# Patient Record
Sex: Female | Born: 1985
Health system: Southern US, Community
[De-identification: ages and names within clinical notes are randomized; demographics above are authoritative.]

## PROBLEM LIST (undated history)

## (undated) ENCOUNTER — Inpatient Hospital Stay (HOSPITAL_COMMUNITY): Payer: Self-pay

## (undated) DIAGNOSIS — B009 Herpesviral infection, unspecified: Secondary | ICD-10-CM

## (undated) DIAGNOSIS — Z789 Other specified health status: Secondary | ICD-10-CM

## (undated) HISTORY — DX: Herpesviral infection, unspecified: B00.9

## (undated) HISTORY — PX: NO PAST SURGERIES: SHX2092

## (undated) HISTORY — DX: Other specified health status: Z78.9

---

## 2013-09-23 ENCOUNTER — Emergency Department (HOSPITAL_COMMUNITY)
Admission: EM | Admit: 2013-09-23 | Discharge: 2013-09-23 | Disposition: A | Discharge status: Home / Self Care | Payer: BC Managed Care – PPO | Attending: Emergency Medicine | Admitting: Emergency Medicine

## 2013-09-23 ENCOUNTER — Encounter (HOSPITAL_COMMUNITY): Payer: Self-pay | Admitting: Emergency Medicine

## 2013-09-23 DIAGNOSIS — F17200 Nicotine dependence, unspecified, uncomplicated: Secondary | ICD-10-CM | POA: Insufficient documentation

## 2013-09-23 DIAGNOSIS — R110 Nausea: Secondary | ICD-10-CM | POA: Insufficient documentation

## 2013-09-23 DIAGNOSIS — R51 Headache: Secondary | ICD-10-CM | POA: Insufficient documentation

## 2013-09-23 DIAGNOSIS — R519 Headache, unspecified: Secondary | ICD-10-CM

## 2013-09-23 MED ORDER — SODIUM CHLORIDE 0.9 % IV BOLUS (SEPSIS)
1000.0000 mL | Freq: Once | INTRAVENOUS | Status: AC
  Administered 2013-09-23: 1000 mL via INTRAVENOUS

## 2013-09-23 MED ORDER — DEXAMETHASONE SODIUM PHOSPHATE 4 MG/ML IJ SOLN
10.0000 mg | Freq: Once | INTRAMUSCULAR | Status: AC
  Administered 2013-09-23: 10 mg via INTRAVENOUS
  Filled 2013-09-23: qty 3

## 2013-09-23 MED ORDER — IBUPROFEN 800 MG PO TABS: 800.0000 mg | ORAL_TABLET | Freq: Three times a day (TID) | ORAL | Status: DC

## 2013-09-23 MED ORDER — METOCLOPRAMIDE HCL 5 MG/ML IJ SOLN
10.0000 mg | Freq: Once | INTRAMUSCULAR | Status: AC
  Administered 2013-09-23: 10 mg via INTRAVENOUS
  Filled 2013-09-23: qty 2

## 2013-09-23 MED ORDER — PROMETHAZINE HCL 25 MG PO TABS: 25.0000 mg | ORAL_TABLET | Freq: Four times a day (QID) | ORAL | Status: DC | PRN

## 2013-09-23 MED ORDER — DIPHENHYDRAMINE HCL 50 MG/ML IJ SOLN
25.0000 mg | Freq: Once | INTRAMUSCULAR | Status: AC
  Administered 2013-09-23: 25 mg via INTRAVENOUS
  Filled 2013-09-23: qty 1

## 2013-09-23 NOTE — ED Provider Notes (Signed)
CSN: [id number]     Arrival date & time 09/23/13  0053 History   First MD Initiated Contact with Patient 09/23/13 0058     Chief Complaint  Patient presents with  . Headache  . Nausea     (Consider location/radiation/quality/duration/timing/severity/associated sxs/prior Treatment) HPI The history is provided by the patient. Left-sided headache onset one to 2 weeks ago has been on and off. She's been having trouble sleeping over this timeframe due to headache and nausea. Tonight symptoms seem more severe. Has associated nausea vomiting. No diarrhea. No fevers or chills. No neck pain or neck stiffness. No rash. No recent travel. No known sick contacts. Pain is moderate to severe, throbbing in quality. Patient states she has history of migraines as a child but not as an adult. She does have family history of migraine headaches. No change in vision. No weakness or numbness. Symptoms gradually onset and progressively worsening. Some intermittent relief with over-the-counter medications but not tonight.   History reviewed. No pertinent past medical history. History reviewed. No pertinent past surgical history. No family history on file. History  Substance Use Topics  . Smoking status: Current Every Day Smoker  . Smokeless tobacco: Not on file  . Alcohol Use: Yes   OB History   Grav Para Term Preterm Abortions TAB SAB Ect Mult Living                 Review of Systems  Constitutional: Negative for fever and chills.  Eyes: Negative for pain.  Respiratory: Negative for shortness of breath.   Cardiovascular: Negative for chest pain.  Gastrointestinal: Positive for nausea. Negative for abdominal pain.  Genitourinary: Negative for dysuria.  Musculoskeletal: Negative for back pain, neck pain and neck stiffness.  Skin: Negative for rash.  Neurological: Positive for headaches. Negative for seizures, syncope, weakness and numbness.  All other systems reviewed and are  negative.      Allergies  Review of patient's allergies indicates no known allergies.  Home Medications   Current Outpatient Rx  Name  Route  Sig  Dispense  Refill  . acetaminophen (TYLENOL) 325 MG tablet   Oral   Take 650 mg by mouth every 6 (six) hours as needed.         Marland Kitchen. ibuprofen (ADVIL,MOTRIN) 200 MG tablet   Oral   Take 200 mg by mouth every 6 (six) hours as needed.          BP 139/87  Pulse 98  Resp 18  Ht 5\' 11"  (1.803 m)  Wt 198 lb (89.812 kg)  BMI 27.63 kg/m2  SpO2 100%  LMP 09/10/2013 Physical Exam  Constitutional: She is oriented to person, place, and time. She appears well-developed and well-nourished.  HENT:  Head: Normocephalic and atraumatic.  No tenderness over TMJ or temporal arteries. No trismus. TMs clear.   Eyes: Conjunctivae and EOM are normal. Pupils are equal, round, and reactive to light.  Neck: Full passive range of motion without pain. Neck supple. No thyromegaly present.  No meningismus  Cardiovascular: Normal rate, regular rhythm, S1 normal, S2 normal and intact distal pulses.   Pulmonary/Chest: Effort normal and breath sounds normal.  Abdominal: Soft. Bowel sounds are normal. There is no tenderness. There is no CVA tenderness.  Musculoskeletal: Normal range of motion.  Neurological: She is alert and oriented to person, place, and time. She has normal strength and normal reflexes. No cranial nerve deficit or sensory deficit. She displays a negative Romberg sign. GCS eye subscore is 4.  GCS verbal subscore is 5. GCS motor subscore is 6.  Normal Gait  Skin: Skin is warm and dry. No rash noted. No cyanosis. Nails show no clubbing.  Psychiatric: She has a normal mood and affect. Her speech is normal and behavior is normal.    ED Course  Procedures (including critical care time) Labs Review Labs Reviewed - No data to display Imaging Review No results found.  IV fluids and headache cocktail provided: Benadryl, Decadron, Reglan  On  recheck headache significantly improved. No further nausea. Tolerates by mouth fluids. Ambulates to the bathroom no acute distress.  Plan discharge home with prescription for Phenergan and Motrin as needed. Patient agrees to fully sleep. Work note provided. Outpatient referrals as needed. Patient verbalizes understanding all discharge and followup instructions and strict return precautions  MDM   Dx: headache  Improved with IV fluids and medications. Serial normal neuro exams. No indication for advanced imaging or lumbar puncture at this time. Vital signs and nurses notes reviewed and considered   Sunnie Nielsen, MD 09/23/13 [phone]

## 2013-09-23 NOTE — ED Notes (Signed)
Headache off and on for a couple of weeks, nausea

## 2013-09-23 NOTE — Discharge Instructions (Signed)
Migraine Headache A migraine headache is an intense, throbbing pain on one or both sides of your head. A migraine can last for 30 minutes to several hours. CAUSES  The exact cause of a migraine headache is not always known. However, a migraine may be caused when nerves in the brain become irritated and release chemicals that cause inflammation. This causes pain. Certain things may also trigger migraines, such as:  Alcohol.  Smoking.  Stress.  Menstruation.  Aged cheeses.  Foods or drinks that contain nitrates, glutamate, aspartame, or tyramine.  Lack of sleep.  Chocolate.  Caffeine.  Hunger.  Physical exertion.  Fatigue.  Medicines used to treat chest pain (nitroglycerine), birth control pills, estrogen, and some blood pressure medicines. SIGNS AND SYMPTOMS  Pain on one or both sides of your head.  Pulsating or throbbing pain.  Severe pain that prevents daily activities.  Pain that is aggravated by any physical activity.  Nausea, vomiting, or both.  Dizziness.  Pain with exposure to bright lights, loud noises, or activity.  General sensitivity to bright lights, loud noises, or smells. Before you get a migraine, you may get warning signs that a migraine is coming (aura). An aura may include:  Seeing flashing lights.  Seeing bright spots, halos, or zig-zag lines.  Having tunnel vision or blurred vision.  Having feelings of numbness or tingling.  Having trouble talking.  Having muscle weakness. DIAGNOSIS  A migraine headache is often diagnosed based on:  Symptoms.  Physical exam.  A CT scan or MRI of your head. These imaging tests cannot diagnose migraines, but they can help rule out other causes of headaches. TREATMENT Medicines may be given for pain and nausea. Medicines can also be given to help prevent recurrent migraines.  HOME CARE INSTRUCTIONS  Only take over-the-counter or prescription medicines for pain or discomfort as directed by your  health care provider. The use of long-term narcotics is not recommended.  Lie down in a dark, quiet room when you have a migraine.  Keep a journal to find out what may trigger your migraine headaches. For example, write down:  What you eat and drink.  How much sleep you get.  Any change to your diet or medicines.  Limit alcohol consumption.  Quit smoking if you smoke.  Get 7 9 hours of sleep, or as recommended by your health care provider.  Limit stress.  Keep lights dim if bright lights bother you and make your migraines worse. SEEK IMMEDIATE MEDICAL CARE IF:   Your migraine becomes severe.  You have a fever.  You have a stiff neck.  You have vision loss.  You have muscular weakness or loss of muscle control.  You start losing your balance or have trouble walking.  You feel faint or pass out.  You have severe symptoms that are different from your first symptoms. MAKE SURE YOU:   Understand these instructions.  Will watch your condition.  Will get help right away if you are not doing well or get worse. Document Released: 07/18/2005 Document Revised: 05/08/2013 Document Reviewed: 03/25/2013 [employer] Patient Information 2014 [employer].  

## 2016-04-10 ENCOUNTER — Emergency Department (HOSPITAL_COMMUNITY): Payer: BLUE CROSS/BLUE SHIELD

## 2016-04-10 ENCOUNTER — Encounter (HOSPITAL_COMMUNITY): Payer: Self-pay | Admitting: Emergency Medicine

## 2016-04-10 ENCOUNTER — Emergency Department (HOSPITAL_COMMUNITY)
Admission: EM | Admit: 2016-04-10 | Discharge: 2016-04-10 | Disposition: A | Discharge status: Home / Self Care | Payer: BLUE CROSS/BLUE SHIELD | Attending: Emergency Medicine | Admitting: Emergency Medicine

## 2016-04-10 DIAGNOSIS — R519 Headache, unspecified: Secondary | ICD-10-CM

## 2016-04-10 DIAGNOSIS — R51 Headache: Secondary | ICD-10-CM | POA: Insufficient documentation

## 2016-04-10 DIAGNOSIS — F17210 Nicotine dependence, cigarettes, uncomplicated: Secondary | ICD-10-CM | POA: Diagnosis not present

## 2016-04-10 LAB — POC URINE PREG, ED: Preg Test, Ur: NEGATIVE

## 2016-04-10 MED ORDER — PROCHLORPERAZINE EDISYLATE 5 MG/ML IJ SOLN
10.0000 mg | Freq: Once | INTRAMUSCULAR | Status: AC
Start: 2016-04-10 — End: 2016-04-10
  Administered 2016-04-10: 10 mg via INTRAVENOUS
  Filled 2016-04-10: qty 2

## 2016-04-10 MED ORDER — DIPHENHYDRAMINE HCL 50 MG/ML IJ SOLN
25.0000 mg | Freq: Once | INTRAMUSCULAR | Status: AC
  Administered 2016-04-10: 25 mg via INTRAVENOUS
  Filled 2016-04-10: qty 1

## 2016-04-10 MED ORDER — BUTALBITAL-APAP-CAFFEINE 50-325-40 MG PO TABS: 1.0000 | ORAL_TABLET | Freq: Four times a day (QID) | ORAL | 0 refills | Status: DC | PRN

## 2016-04-10 MED ORDER — KETOROLAC TROMETHAMINE 30 MG/ML IJ SOLN
30.0000 mg | Freq: Once | INTRAMUSCULAR | Status: AC
  Administered 2016-04-10: 30 mg via INTRAVENOUS
  Filled 2016-04-10: qty 1

## 2016-04-10 NOTE — ED Notes (Signed)
URINE PREG NEGATIVE 

## 2016-04-10 NOTE — ED Triage Notes (Signed)
Patient c/o intermittent headache x1 month that has progressively gotten worse in past week. Per patient sensitivity to light and sound with nausea and neck pain. Per patient taking tylenol, tylenol sinus, nightquil, and ibuprofen with no relief.

## 2016-04-10 NOTE — ED Provider Notes (Signed)
[facility]-EMERGENCY DEPT Provider Note   CSN: [id number] Arrival date & time: 04/10/16  1643  By signing my name below, I, Christy SartoriusAnastasia Kolousek, attest that this documentation has been prepared under the direction and in the presence of  Burgess AmorJulie Stephon Weathers, PA-C. Electronically Signed: Christy SartoriusAnastasia Kolousek, ED Scribe. 04/10/16. 6:38 PM.  History   Chief Complaint Chief Complaint  Patient presents with  . Headache   The history is provided by the patient and medical records. No language interpreter was used.    HPI Comments:  Deborah Taylor is a 30 y.o. female who presents to the Emergency Department complaining of constant migraines for the last week.  She states she has a persistent dull pain during the day, but at night when she lies down to go to sleep her pain is worse.  She describes it as sharp pains that begin above her eyebrow and at the top of her head and radiate difusely.  She has a history of migraines, but states that they are never this frequent and are not worse at night.  She also notes that she normally has nausea, light sensitivity and changes to her vision with her migraines, but denies nausea and change in vision with this headache.    She has tried ibuprofen, Tylenol, Advil and Tylenol sinus without relief.   She has a family history of brain tumors and is concerned about this history.  She denies neck stiffness, head injury, numbness, increased stress, changes in diet or fluid intake and known medicinal allergies.  Her PCP is Dr. Catalina PizzaZach Hall.    History reviewed. No pertinent past medical history.  There are no active problems to display for this patient.   History reviewed. No pertinent surgical history.  OB History    Gravida Para Term Preterm AB Living             0   SAB TAB Ectopic Multiple Live Births                   Home Medications    Prior to Admission medications   Medication Sig Start Date End Date Taking? Authorizing Provider  acetaminophen (TYLENOL) 325 MG  tablet Take 650 mg by mouth every 6 (six) hours as needed.    Historical Provider, MD  ibuprofen (ADVIL,MOTRIN) 200 MG tablet Take 200 mg by mouth every 6 (six) hours as needed.    Historical Provider, MD  ibuprofen (ADVIL,MOTRIN) 800 MG tablet Take 1 tablet (800 mg total) by mouth 3 (three) times daily. 09/23/13   Sunnie NielsenBrian Opitz, MD  promethazine (PHENERGAN) 25 MG tablet Take 1 tablet (25 mg total) by mouth every 6 (six) hours as needed for nausea or vomiting. 09/23/13   Sunnie NielsenBrian Opitz, MD    Family History History reviewed. No pertinent family history.  Social History Social History  Substance Use Topics  . Smoking status: Current Every Day Smoker    Packs/day: 0.25    Years: 10.00    Types: Cigarettes  . Smokeless tobacco: Never Used  . Alcohol use No     Allergies   Review of patient's allergies indicates no known allergies.   Review of Systems Review of Systems  Constitutional: Negative for fever.  HENT: Negative for congestion and sore throat.   Eyes: Negative.   Respiratory: Negative for chest tightness and shortness of breath.   Cardiovascular: Negative for chest pain.  Gastrointestinal: Negative for abdominal pain and nausea.  Genitourinary: Negative.   Musculoskeletal: Negative for arthralgias, joint swelling  and neck pain.  Skin: Negative.  Negative for rash and wound.  Neurological: Positive for headaches. Negative for dizziness, weakness, light-headedness and numbness.  Psychiatric/Behavioral: Negative.      Physical Exam Updated Vital Signs BP 120/65 (BP Location: Left Arm)   Pulse 60   Temp 98.3 F (36.8 C) (Oral)   Resp 16   Ht 5\' 11"  (1.803 m)   Wt 235 lb (106.6 kg)   LMP 03/16/2016   SpO2 100%   BMI 32.78 kg/m   Physical Exam  Constitutional: She is oriented to person, place, and time. She appears well-developed and well-nourished. No distress.  Uncomfortable appearing  HENT:  Head: Normocephalic and atraumatic.  Mouth/Throat: Oropharynx is clear  and moist.  Eyes: Conjunctivae and EOM are normal. Pupils are equal, round, and reactive to light.  Neck: Normal range of motion. Neck supple.  Cardiovascular: Normal rate and normal heart sounds.   Pulmonary/Chest: Effort normal.  Abdominal: Soft. There is no tenderness.  Musculoskeletal: Normal range of motion.  Lymphadenopathy:    She has no cervical adenopathy.  Neurological: She is alert and oriented to person, place, and time. She has normal strength. No sensory deficit. Gait normal. GCS eye subscore is 4. GCS verbal subscore is 5. GCS motor subscore is 6.  Normal heel-shin, normal rapid alternating movements. Cranial nerves III-XII intact.  No pronator drift.  Skin: Skin is warm and dry. No rash noted.  Psychiatric: She has a normal mood and affect. Her speech is normal and behavior is normal. Thought content normal. Cognition and memory are normal.  Nursing note and vitals reviewed.    ED Treatments / Results   DIAGNOSTIC STUDIES:  Oxygen Saturation is 100% on RA, NML by my interpretation.    COORDINATION OF CARE:  6:09 PM Discussed treatment plfan with pt at bedside and pt agreed to plan.  Labs (all labs ordered are listed, but only abnormal results are displayed) Labs Reviewed - No data to display  EKG  EKG Interpretation None       Radiology Ct Head Wo Contrast  Result Date: 04/10/2016 CLINICAL DATA:  Left-sided headache for 1 week with pain behind the left eye. EXAM: CT HEAD WITHOUT CONTRAST TECHNIQUE: Contiguous axial images were obtained from the base of the skull through the vertex without intravenous contrast. COMPARISON:  None. FINDINGS: Brain: No evidence of acute infarction, hemorrhage, hydrocephalus, extra-axial collection or mass lesion/mass effect. Presumed dilated perivascular spaces below the putamina. Vascular: No hyperdense vessel or unexpected calcification. Skull: Normal. Negative for fracture or focal lesion. Sinuses/Orbits: Negative. No acute  sinusitis. No orbital mass or inflammation. Other: None. IMPRESSION: Negative.  No explanation for headache. Electronically Signed   By: Marnee Spring M.D.   On: 04/10/2016 19:18    Procedures Procedures (including critical care time)  Medications Ordered in ED Medications  prochlorperazine (COMPAZINE) injection 10 mg (10 mg Intravenous Given 04/10/16 1842)  diphenhydrAMINE (BENADRYL) injection 25 mg (25 mg Intravenous Given 04/10/16 1846)  ketorolac (TORADOL) 30 MG/ML injection 30 mg (30 mg Intravenous Given 04/10/16 1843)     Initial Impression / Assessment and Plan / ED Course  I have reviewed the triage vital signs and the nursing notes.  Pertinent labs & imaging results that were available during my care of the patient were reviewed by me and considered in my medical decision making (see chart for details).  Clinical Course    Pt given migraine cocktail meds with resolution of headache.  Prescribed fioricet. Advised recheck by  pcp prn if sx persist.  Final Clinical Impressions(s) / ED Diagnoses   Final diagnoses:  None    New Prescriptions New Prescriptions   BUTALBITAL-ACETAMINOPHEN-CAFFEINE (FIORICET) 50-325-40 MG TABLET    Take 1 tablet by mouth every 6 (six) hours as needed for headache.   I personally performed the services described in this documentation, which was scribed in my presence. The recorded information has been reviewed and is accurate.     Burgess Amor, PA-C 04/10/16 2015    Vanetta Mulders, MD 04/13/16 [phone]

## 2016-07-05 ENCOUNTER — Encounter: Payer: Self-pay | Admitting: Adult Health

## 2016-07-11 ENCOUNTER — Other Ambulatory Visit: Payer: Self-pay | Admitting: Obstetrics and Gynecology

## 2016-07-11 DIAGNOSIS — O3680X0 Pregnancy with inconclusive fetal viability, not applicable or unspecified: Secondary | ICD-10-CM

## 2016-07-12 ENCOUNTER — Encounter: Payer: Self-pay | Admitting: *Deleted

## 2016-07-12 ENCOUNTER — Ambulatory Visit (INDEPENDENT_AMBULATORY_CARE_PROVIDER_SITE_OTHER): Payer: BLUE CROSS/BLUE SHIELD

## 2016-07-12 ENCOUNTER — Ambulatory Visit (INDEPENDENT_AMBULATORY_CARE_PROVIDER_SITE_OTHER): Payer: BLUE CROSS/BLUE SHIELD | Admitting: *Deleted

## 2016-07-12 VITALS — BP 124/90 | HR 80 | Wt 239.0 lb

## 2016-07-12 DIAGNOSIS — Z1389 Encounter for screening for other disorder: Secondary | ICD-10-CM

## 2016-07-12 DIAGNOSIS — Z3682 Encounter for antenatal screening for nuchal translucency: Secondary | ICD-10-CM

## 2016-07-12 DIAGNOSIS — Z331 Pregnant state, incidental: Secondary | ICD-10-CM | POA: Diagnosis not present

## 2016-07-12 DIAGNOSIS — O3680X0 Pregnancy with inconclusive fetal viability, not applicable or unspecified: Secondary | ICD-10-CM | POA: Diagnosis not present

## 2016-07-12 DIAGNOSIS — Z3401 Encounter for supervision of normal first pregnancy, first trimester: Secondary | ICD-10-CM | POA: Diagnosis not present

## 2016-07-12 DIAGNOSIS — Z3481 Encounter for supervision of other normal pregnancy, first trimester: Secondary | ICD-10-CM

## 2016-07-12 DIAGNOSIS — Z3A08 8 weeks gestation of pregnancy: Secondary | ICD-10-CM

## 2016-07-12 DIAGNOSIS — O99331 Smoking (tobacco) complicating pregnancy, first trimester: Secondary | ICD-10-CM

## 2016-07-12 DIAGNOSIS — F17200 Nicotine dependence, unspecified, uncomplicated: Secondary | ICD-10-CM | POA: Insufficient documentation

## 2016-07-12 LAB — POCT URINALYSIS DIPSTICK
Blood, UA: NEGATIVE
GLUCOSE UA: NEGATIVE
Ketones, UA: NEGATIVE
LEUKOCYTES UA: NEGATIVE
NITRITE UA: NEGATIVE
Protein, UA: NEGATIVE

## 2016-07-12 NOTE — Progress Notes (Signed)
US 8+1 wks,single IUP w/ys,pos fht 168 bpm,normal ov's bilat,crl 18.9 mm,EDD 02/20/2017 by LMP

## 2016-07-12 NOTE — Progress Notes (Deleted)
Patient's health and obstetrical history obtained and reviewed. No significant medical or obstetrical history. CCNC form completed. PN1 labs drawn. Baby scripts and mychart activated. Patient scheduled for new ob visit.   Patient Active Problem List   Diagnosis Date Noted  . Smoker 07/12/2016  . Supervision of normal first pregnancy 07/12/2016   Past Medical History:  Diagnosis Date  . Medical history non-contributory    Past Surgical History:  Procedure Laterality Date  . NO PAST SURGERIES     OB History    Gravida Para Term Preterm AB Living   2       1 0   SAB TAB Ectopic Multiple Live Births     1            Current Outpatient Prescriptions:  .  acetaminophen (TYLENOL) 325 MG tablet, Take 650 mg by mouth every 6 (six) hours as needed., Disp: , Rfl:  .  prenatal vitamin w/FE, FA (PRENATAL 1 + 1) 27-1 MG TABS tablet, Take 1 tablet by mouth daily at 12 noon., Disp: , Rfl:  .  butalbital-acetaminophen-caffeine (FIORICET) 50-325-40 MG tablet, Take 1 tablet by mouth every 6 (six) hours as needed for headache. (Patient not taking: Reported on 07/12/2016), Disp: 20 tablet, Rfl: 0 .  ibuprofen (ADVIL,MOTRIN) 200 MG tablet, Take 200 mg by mouth every 6 (six) hours as needed., Disp: , Rfl:  .  ibuprofen (ADVIL,MOTRIN) 800 MG tablet, Take 1 tablet (800 mg total) by mouth 3 (three) times daily. (Patient not taking: Reported on 07/12/2016), Disp: 21 tablet, Rfl: 0 .  promethazine (PHENERGAN) 25 MG tablet, Take 1 tablet (25 mg total) by mouth every 6 (six) hours as needed for nausea or vomiting. (Patient not taking: Reported on 07/12/2016), Disp: 30 tablet, Rfl: 0 Allergies  Allergen Reactions  . Latex Itching

## 2016-07-13 ENCOUNTER — Encounter: Payer: Self-pay | Admitting: Women's Health

## 2016-07-13 DIAGNOSIS — O09899 Supervision of other high risk pregnancies, unspecified trimester: Secondary | ICD-10-CM | POA: Insufficient documentation

## 2016-07-13 DIAGNOSIS — Z2839 Other underimmunization status: Secondary | ICD-10-CM | POA: Insufficient documentation

## 2016-07-13 DIAGNOSIS — Z283 Underimmunization status: Secondary | ICD-10-CM | POA: Insufficient documentation

## 2016-07-13 DIAGNOSIS — O26899 Other specified pregnancy related conditions, unspecified trimester: Secondary | ICD-10-CM

## 2016-07-13 DIAGNOSIS — O0990 Supervision of high risk pregnancy, unspecified, unspecified trimester: Secondary | ICD-10-CM | POA: Insufficient documentation

## 2016-07-13 DIAGNOSIS — Z6791 Unspecified blood type, Rh negative: Secondary | ICD-10-CM | POA: Insufficient documentation

## 2016-07-13 DIAGNOSIS — O9989 Other specified diseases and conditions complicating pregnancy, childbirth and the puerperium: Secondary | ICD-10-CM

## 2016-07-13 LAB — PMP SCREEN PROFILE (10S), URINE
Amphetamine Screen, Ur: NEGATIVE ng/mL
BARBITURATE SCRN UR: NEGATIVE ng/mL
BENZODIAZEPINE SCREEN, URINE: NEGATIVE ng/mL
Cannabinoids Ur Ql Scn: NEGATIVE ng/mL
Cocaine(Metab.)Screen, Urine: NEGATIVE ng/mL
Creatinine(Crt), U: 168.9 mg/dL (ref 20.0–300.0)
Methadone Scn, Ur: NEGATIVE ng/mL
OXYCODONE+OXYMORPHONE UR QL SCN: NEGATIVE ng/mL
Opiate Scrn, Ur: NEGATIVE ng/mL
PCP Scrn, Ur: NEGATIVE ng/mL
PH UR, DRUG SCRN: 6.3 (ref 4.5–8.9)
Propoxyphene, Screen: NEGATIVE ng/mL

## 2016-07-13 LAB — CBC
Hematocrit: 37.8 % (ref 34.0–46.6)
Hemoglobin: 12.7 g/dL (ref 11.1–15.9)
MCH: 29.1 pg (ref 26.6–33.0)
MCHC: 33.6 g/dL (ref 31.5–35.7)
MCV: 87 fL (ref 79–97)
PLATELETS: 310 10*3/uL (ref 150–379)
RBC: 4.36 x10E6/uL (ref 3.77–5.28)
RDW: 14.9 % (ref 12.3–15.4)
WBC: 13.2 10*3/uL — AB (ref 3.4–10.8)

## 2016-07-13 LAB — URINALYSIS, ROUTINE W REFLEX MICROSCOPIC
Bilirubin, UA: NEGATIVE
GLUCOSE, UA: NEGATIVE
Ketones, UA: NEGATIVE
Leukocytes, UA: NEGATIVE
Nitrite, UA: NEGATIVE
PH UA: 6.5 (ref 5.0–7.5)
PROTEIN UA: NEGATIVE
RBC, UA: NEGATIVE
Specific Gravity, UA: 1.027 (ref 1.005–1.030)
Urobilinogen, Ur: 0.2 mg/dL (ref 0.2–1.0)

## 2016-07-13 LAB — HEPATITIS B SURFACE ANTIGEN: HEP B S AG: NEGATIVE

## 2016-07-13 LAB — HIV ANTIBODY (ROUTINE TESTING W REFLEX): HIV Screen 4th Generation wRfx: NONREACTIVE

## 2016-07-13 LAB — RUBELLA SCREEN: Rubella Antibodies, IGG: <0.9 index — ABNORMAL LOW (ref >0.99)

## 2016-07-13 LAB — ABO/RH: RH TYPE: NEGATIVE

## 2016-07-13 LAB — ANTIBODY SCREEN: ANTIBODY SCREEN: NEGATIVE

## 2016-07-13 LAB — VARICELLA ZOSTER ANTIBODY, IGG: VARICELLA: 1860 {index} (ref >165)

## 2016-07-13 LAB — RPR: RPR Ser Ql: NONREACTIVE

## 2016-07-13 NOTE — Progress Notes (Signed)
Dannette BarbaraHannah P Paxson is a 30 y.o. [id number] female here today for initial OB intake/educational visit with RN  Patient's medical, surgical, and obstetrical history obtained and reviewed.  Current medications and allergies also reviewed.   Dating ultrasound today revealed GA of [redacted]wk1d based on LMP and EDC of 02/20/17   BP 124/90   Pulse 80   Wt 239 lb (108.4 kg)   LMP 05/16/2016 (Approximate)   BMI 33.33 kg/m   Patient Active Problem List   Diagnosis Date Noted  . Encounter for supervision of other normal pregnancy 07/13/2016  . Smoker 07/12/2016   Past Medical History:  Diagnosis Date  . Medical history non-contributory    Past Surgical History:  Procedure Laterality Date  . NO PAST SURGERIES     OB History    Gravida Para Term Preterm AB Living   2       1 0   SAB TAB Ectopic Multiple Live Births     1            She is taking prenatal vitamins CCNC form completed PN1 labs drawn Baby scripts and mychart activated  Reviewed recommended weight gain based on pre-gravid BMI  Genetic Screening discussed Integrated Scree: requested/ordered Cystic fibrosis screening discussed declined  Face-to-face time at least 30 minutes. 50% or more of this visit was spent in counseling and coordination of care.  Return in about 4 weeks (around 08/09/2016).   Edward JollyMadren, Latisha Michelle RN-C 07/13/2016 12:02 PM

## 2016-07-14 LAB — URINE CULTURE

## 2016-07-14 LAB — GC/CHLAMYDIA PROBE AMP
Chlamydia trachomatis, NAA: NEGATIVE
NEISSERIA GONORRHOEAE BY PCR: NEGATIVE

## 2016-07-15 ENCOUNTER — Telehealth: Payer: Self-pay | Admitting: Women's Health

## 2016-07-15 DIAGNOSIS — O9989 Other specified diseases and conditions complicating pregnancy, childbirth and the puerperium: Principal | ICD-10-CM

## 2016-07-15 DIAGNOSIS — R8271 Bacteriuria: Secondary | ICD-10-CM

## 2016-07-15 MED ORDER — PENICILLIN V POTASSIUM 500 MG PO TABS: 500.0000 mg | ORAL_TABLET | Freq: Four times a day (QID) | ORAL | 0 refills | Status: DC

## 2016-07-15 NOTE — Telephone Encounter (Signed)
LM for pt to return call. Need to notify of +urine cx, rx pcn.  Cheral MarkerKimberly R. Maydelin Deming, CNM, [facility] 07/15/2016 2:44 PM

## 2016-07-18 ENCOUNTER — Telehealth: Payer: Self-pay | Admitting: Women's Health

## 2016-07-18 NOTE — Telephone Encounter (Signed)
LM for pt to return call, need to notify of uti, rx pcn.  Cheral MarkerKimberly R. Davionna Blacksher, CNM, [facility] 07/18/2016 1:36 PM

## 2016-07-27 ENCOUNTER — Telehealth: Payer: Self-pay | Admitting: Women's Health

## 2016-07-27 NOTE — Telephone Encounter (Signed)
Called pt to make sure she was aware of uti, states she was and that she picked up rx from pharmacy and has completed. She had to call after hours line yesterday b/c antibiotic gave her a yeast infection- they told her to take otc monistat 7. Did apply for and get pregnancy mcaid, just bring card to next appt.  Cheral MarkerKimberly R. Tedric Leeth, CNM, [facility] 07/27/2016 1:27 PM

## 2016-08-01 NOTE — L&D Delivery Note (Signed)
Delivery Note At 11:37 PM a viable female was delivered via Vaginal, Spontaneous Delivery (Presentation: OA).  APGAR: 7, 8; weight  pending.   Placenta status: delivered intact.  Cord: 3v with the following complications: none.  Cord pH: n/a  Anesthesia: epidural and local   Episiotomy: None Lacerations: 2nd degree Suture Repair: 2.0 vicryl rapide Est. Blood Loss (mL): 400  Mom to postpartum.  Baby to Couplet care / Skin to Skin.  Donette LarryMelanie Asahel Risden, CNM 01/31/2017, 11:59 PM

## 2016-08-10 ENCOUNTER — Encounter: Payer: BLUE CROSS/BLUE SHIELD | Admitting: Women's Health

## 2016-08-10 ENCOUNTER — Other Ambulatory Visit: Payer: BLUE CROSS/BLUE SHIELD

## 2016-08-10 ENCOUNTER — Encounter: Payer: BLUE CROSS/BLUE SHIELD | Admitting: Advanced Practice Midwife

## 2016-08-11 ENCOUNTER — Ambulatory Visit (INDEPENDENT_AMBULATORY_CARE_PROVIDER_SITE_OTHER): Payer: BLUE CROSS/BLUE SHIELD

## 2016-08-11 ENCOUNTER — Other Ambulatory Visit (HOSPITAL_COMMUNITY)
Admission: RE | Admit: 2016-08-11 | Discharge: 2016-08-11 | Disposition: A | Discharge status: Home / Self Care | Payer: BLUE CROSS/BLUE SHIELD | Source: Ambulatory Visit | Attending: Obstetrics & Gynecology | Admitting: Obstetrics & Gynecology

## 2016-08-11 ENCOUNTER — Encounter: Payer: Self-pay | Admitting: Women's Health

## 2016-08-11 ENCOUNTER — Ambulatory Visit (INDEPENDENT_AMBULATORY_CARE_PROVIDER_SITE_OTHER): Payer: BLUE CROSS/BLUE SHIELD | Admitting: Women's Health

## 2016-08-11 VITALS — BP 126/64 | HR 79 | Wt 244.0 lb

## 2016-08-11 DIAGNOSIS — Z3481 Encounter for supervision of other normal pregnancy, first trimester: Secondary | ICD-10-CM

## 2016-08-11 DIAGNOSIS — Z3A13 13 weeks gestation of pregnancy: Secondary | ICD-10-CM

## 2016-08-11 DIAGNOSIS — Z3682 Encounter for antenatal screening for nuchal translucency: Secondary | ICD-10-CM

## 2016-08-11 DIAGNOSIS — Z01419 Encounter for gynecological examination (general) (routine) without abnormal findings: Secondary | ICD-10-CM | POA: Insufficient documentation

## 2016-08-11 DIAGNOSIS — Z1389 Encounter for screening for other disorder: Secondary | ICD-10-CM

## 2016-08-11 DIAGNOSIS — F17200 Nicotine dependence, unspecified, uncomplicated: Secondary | ICD-10-CM

## 2016-08-11 DIAGNOSIS — Z331 Pregnant state, incidental: Secondary | ICD-10-CM

## 2016-08-11 DIAGNOSIS — L299 Pruritus, unspecified: Secondary | ICD-10-CM

## 2016-08-11 DIAGNOSIS — Z124 Encounter for screening for malignant neoplasm of cervix: Secondary | ICD-10-CM

## 2016-08-11 LAB — POCT URINALYSIS DIPSTICK
Blood, UA: NEGATIVE
GLUCOSE UA: NEGATIVE
Ketones, UA: NEGATIVE
Leukocytes, UA: NEGATIVE
NITRITE UA: NEGATIVE
Protein, UA: NEGATIVE

## 2016-08-11 NOTE — Patient Instructions (Signed)
Second Trimester of Pregnancy The second trimester is from week 13 through week 28 (months 4 through 6). The second trimester is often a time when you feel your best. Your body has also adjusted to being pregnant, and you begin to feel better physically. Usually, morning sickness has lessened or quit completely, you may have more energy, and you may have an increase in appetite. The second trimester is also a time when the fetus is growing rapidly. At the end of the sixth month, the fetus is about 9 inches long and weighs about 1 pounds. You will likely begin to feel the baby move (quickening) between 18 and 20 weeks of the pregnancy. Body changes during your second trimester Your body continues to go through many changes during your second trimester. The changes vary from woman to woman.  Your weight will continue to increase. You will notice your lower abdomen bulging out.  You may begin to get stretch marks on your hips, abdomen, and breasts.  You may develop headaches that can be relieved by medicines. The medicines should be approved by your health care provider.  You may urinate more often because the fetus is pressing on your bladder.  You may develop or continue to have heartburn as a result of your pregnancy.  You may develop constipation because certain hormones are causing the muscles that push waste through your intestines to slow down.  You may develop hemorrhoids or swollen, bulging veins (varicose veins).  You may have back pain. This is caused by:  Weight gain.  Pregnancy hormones that are relaxing the joints in your pelvis.  A shift in weight and the muscles that support your balance.  Your breasts will continue to grow and they will continue to become tender.  Your gums may bleed and may be sensitive to brushing and flossing.  Dark spots or blotches (chloasma, mask of pregnancy) may develop on your face. This will likely fade after the baby is born.  A dark line  from your belly button to the pubic area (linea nigra) may appear. This will likely fade after the baby is born.  You may have changes in your hair. These can include thickening of your hair, rapid growth, and changes in texture. Some women also have hair loss during or after pregnancy, or hair that feels dry or thin. Your hair will most likely return to normal after your baby is born. What to expect at prenatal visits During a routine prenatal visit:  You will be weighed to make sure you and the fetus are growing normally.  Your blood pressure will be taken.  Your abdomen will be measured to track your baby's growth.  The fetal heartbeat will be listened to.  Any test results from the previous visit will be discussed. Your health care provider may ask you:  How you are feeling.  If you are feeling the baby move.  If you have had any abnormal symptoms, such as leaking fluid, bleeding, severe headaches, or abdominal cramping.  If you are using any tobacco products, including cigarettes, chewing tobacco, and electronic cigarettes.  If you have any questions. Other tests that may be performed during your second trimester include:  Blood tests that check for:  Low iron levels (anemia).  Gestational diabetes (between 24 and 28 weeks).  Rh antibodies. This is to check for a protein on red blood cells (Rh factor).  Urine tests to check for infections, diabetes, or protein in the urine.  An ultrasound to   confirm the proper growth and development of the baby.  An amniocentesis to check for possible genetic problems.  Fetal screens for spina bifida and Down syndrome.  HIV (human immunodeficiency virus) testing. Routine prenatal testing includes screening for HIV, unless you choose not to have this test. Follow these instructions at home: Eating and drinking  Continue to eat regular, healthy meals.  Avoid raw meat, uncooked cheese, cat litter boxes, and soil used by cats. These  carry germs that can cause birth defects in the baby.  Take your prenatal vitamins.  Take 1500-2000 mg of calcium daily starting at the 20th week of pregnancy until you deliver your baby.  If you develop constipation:  Take over-the-counter or prescription medicines.  Drink enough fluid to keep your urine clear or pale yellow.  Eat foods that are high in fiber, such as fresh fruits and vegetables, whole grains, and beans.  Limit foods that are high in fat and processed sugars, such as fried and sweet foods. Activity  Exercise only as directed by your health care provider. Experiencing uterine cramps is a good sign to stop exercising.  Avoid heavy lifting, wear low heel shoes, and practice good posture.  Wear your seat belt at all times when driving.  Rest with your legs elevated if you have leg cramps or low back pain.  Wear a good support bra for breast tenderness.  Do not use hot tubs, steam rooms, or saunas. Lifestyle  Avoid all smoking, herbs, alcohol, and unprescribed drugs. These chemicals affect the formation and growth of the baby.  Do not use any products that contain nicotine or tobacco, such as cigarettes and e-cigarettes. If you need help quitting, ask your health care provider.  A sexual relationship may be continued unless your health care provider directs you otherwise. General instructions  Follow your health care provider's instructions regarding medicine use. There are medicines that are either safe or unsafe to take during pregnancy.  Take warm sitz baths to soothe any pain or discomfort caused by hemorrhoids. Use hemorrhoid cream if your health care provider approves.  If you develop varicose veins, wear support hose. Elevate your feet for 15 minutes, 3-4 times a day. Limit salt in your diet.  Visit your dentist if you have not gone yet during your pregnancy. Use a soft toothbrush to brush your teeth and be gentle when you floss.  Keep all follow-up  prenatal visits as told by your health care provider. This is important. Contact a health care provider if:  You have dizziness.  You have mild pelvic cramps, pelvic pressure, or nagging pain in the abdominal area.  You have persistent nausea, vomiting, or diarrhea.  You have a bad smelling vaginal discharge.  You have pain with urination. Get help right away if:  You have a fever.  You are leaking fluid from your vagina.  You have spotting or bleeding from your vagina.  You have severe abdominal cramping or pain.  You have rapid weight gain or weight loss.  You have shortness of breath with chest pain.  You notice sudden or extreme swelling of your face, hands, ankles, feet, or legs.  You have not felt your baby move in over an hour.  You have severe headaches that do not go away with medicine.  You have vision changes. Summary  The second trimester is from week 13 through week 28 (months 4 through 6). It is also a time when the fetus is growing rapidly.  Your body goes   through many changes during pregnancy. The changes vary from woman to woman.  Avoid all smoking, herbs, alcohol, and unprescribed drugs. These chemicals affect the formation and growth your baby.  Do not use any tobacco products, such as cigarettes, chewing tobacco, and e-cigarettes. If you need help quitting, ask your health care provider.  Contact your health care provider if you have any questions. Keep all prenatal visits as told by your health care provider. This is important. This information is not intended to replace advice given to you by your health care provider. Make sure you discuss any questions you have with your health care provider. Document Released: 07/12/2001 Document Revised: 12/24/2015 Document Reviewed: 09/18/2012 Elsevier Interactive Patient Education  2017 [employer]  

## 2016-08-11 NOTE — Progress Notes (Signed)
Subjective:  Deborah Taylor is a 31 y.o. G61P0010 Caucasian female at [redacted]w[redacted]d by LMP c/w 8wk u/s, being seen today for her first obstetrical visit.  Her obstetrical history is significant for EAB x 1, smoker.  Pregnancy history fully reviewed.  Patient reports itching all over x 2wks, some on palms & soles, sometimes worse at night, vaginal itching, bilateral hands numb, Rt foot swells. Denies vb, cramping, uti s/s, abnormal/malodorous vag d/c, or vulvovaginal itching/irritation.  BP 126/64   Pulse 79   Wt 244 lb (110.7 kg)   LMP 05/16/2016 (Approximate)   BMI 34.03 kg/m   HISTORY: OB History  Gravida Para Term Preterm AB Living  2       1 0  SAB TAB Ectopic Multiple Live Births    1          # Outcome Date GA Lbr Len/2nd Weight Sex Delivery Anes PTL Lv  2 Current           1 TAB              Past Medical History:  Diagnosis Date  . Medical history non-contributory    Past Surgical History:  Procedure Laterality Date  . NO PAST SURGERIES     Family History  Problem Relation Age of Onset  . Cancer Paternal Grandfather     liver cancer  . Stroke Paternal Grandmother   . Tremor Paternal Grandmother   . Diabetes Maternal Grandmother   . Cancer Maternal Grandmother     skin cancer  . Heart disease Maternal Grandmother   . Diabetes Maternal Grandfather   . Heart disease Maternal Grandfather   . Cancer Maternal Grandfather     skin cancer  . Kidney Stones Father   . Diabetes Mother   . Cancer Brother     skin cancer  . Cancer Maternal Aunt     pancreatic cancer    Exam   System:     General: Well developed & nourished, no acute distress   Skin: Warm & dry, normal coloration and turgor, no rashes   Neurologic: Alert & oriented, normal mood   Cardiovascular: Regular rate & rhythm   Respiratory: Effort & rate normal, LCTAB, acyanotic   Abdomen: Soft, non tender   Extremities: normal strength, tone   Pelvic Exam:    Perineum: Normal perineum   Vulva: Normal,  no lesions   Vagina:  Normal mucosa, thick clumpy white d/c c/w yeast   Cervix: Normal, bulbous, appears closed   Uterus: Normal size/shape/contour for GA   Thin prep pap smear obtained w/ reflex high risk HPV cotesting FHR: + via u/s   Assessment:   Pregnancy: G2P0010 Patient Active Problem List   Diagnosis Date Noted  . Asymptomatic bacteriuria during pregnancy in first trimester 07/15/2016  . Encounter for supervision of other normal pregnancy 07/13/2016  . Rh negative state in antepartum period 07/13/2016  . Rubella non-immune status, antepartum 07/13/2016  . Smoker 07/12/2016    [redacted]w[redacted]d G2P0010 New OB visit Smoker Vulvovaginal candida Generalized pruriitus CTS  Plan:  Initial labs obtained last visit, doing 1st IT today Will come back Mon for fasting bile acids/CMP Can use benadryl/hydrocortisone cream, cool showers/wash cloths Splints for CTS OTC Monistat 7 for yeast Continue prenatal vitamins Problem list reviewed and updated Reviewed n/v relief measures and warning s/s to report Reviewed recommended weight gain based on pre-gravid BMI Encouraged well-balanced diet Genetic Screening discussed Integrated Screen: doing 1st IT/NT today Cystic fibrosis screening discussed declined  Ultrasound discussed; fetal survey: requested Follow up in MOnday for fasting labs, then 4 weeks for visit and 2nd IT CCNC completed Declined flu shot today Advised smoking cessation  Marge DuncansBooker, Maebel Marasco Randall CNM, [facility]NP-BC 08/11/2016 3:11 PM

## 2016-08-11 NOTE — Progress Notes (Signed)
US 12+3 wks,measurements c/w dates,crl 65.0 mm,normal ov's bilat,NB present,NT 1.6 mm,fhr 149 bpm

## 2016-08-13 LAB — URINE CULTURE

## 2016-08-16 LAB — MATERNAL SCREEN, INTEGRATED #1
CROWN RUMP LENGTH MAT SCREEN: 65 mm
Gest. Age on Collection Date: 12.7 weeks
Maternal Age at EDD: 30.3 years
Nuchal Translucency (NT): 1.6 mm
Number of Fetuses: 1
PAPP-A Value: 277.9 ng/mL
WEIGHT: 244 [lb_av]

## 2016-08-16 LAB — CYTOLOGY - PAP: Diagnosis: NEGATIVE

## 2016-08-17 LAB — COMPREHENSIVE METABOLIC PANEL
ALT: 38 IU/L — AB (ref 0–32)
AST: 30 IU/L (ref 0–40)
Albumin/Globulin Ratio: 1.5 (ref 1.2–2.2)
Albumin: 4 g/dL (ref 3.5–5.5)
Alkaline Phosphatase: 87 IU/L (ref 39–117)
BUN/Creatinine Ratio: 14 (ref 9–23)
BUN: 9 mg/dL (ref 6–20)
CALCIUM: 9.6 mg/dL (ref 8.7–10.2)
CHLORIDE: 102 mmol/L (ref 96–106)
CO2: 22 mmol/L (ref 18–29)
Creatinine, Ser: 0.65 mg/dL (ref 0.57–1.00)
GFR calc Af Amer: 139 mL/min/{1.73_m2} (ref >59)
GFR, EST NON AFRICAN AMERICAN: 120 mL/min/{1.73_m2} (ref >59)
Globulin, Total: 2.6 g/dL (ref 1.5–4.5)
Glucose: 94 mg/dL (ref 65–99)
Potassium: 4.7 mmol/L (ref 3.5–5.2)
Sodium: 139 mmol/L (ref 134–144)
Total Protein: 6.6 g/dL (ref 6.0–8.5)

## 2016-08-17 LAB — BILE ACIDS, TOTAL: Bile Acids Total: 8.8 umol/L (ref 4.7–24.5)

## 2016-09-08 ENCOUNTER — Ambulatory Visit (INDEPENDENT_AMBULATORY_CARE_PROVIDER_SITE_OTHER): Payer: BLUE CROSS/BLUE SHIELD | Admitting: Women's Health

## 2016-09-08 ENCOUNTER — Encounter: Payer: Self-pay | Admitting: Women's Health

## 2016-09-08 VITALS — BP 132/58 | HR 84 | Wt 247.0 lb

## 2016-09-08 DIAGNOSIS — Z1389 Encounter for screening for other disorder: Secondary | ICD-10-CM

## 2016-09-08 DIAGNOSIS — Z331 Pregnant state, incidental: Secondary | ICD-10-CM

## 2016-09-08 DIAGNOSIS — Z3A16 16 weeks gestation of pregnancy: Secondary | ICD-10-CM

## 2016-09-08 DIAGNOSIS — O26892 Other specified pregnancy related conditions, second trimester: Secondary | ICD-10-CM

## 2016-09-08 DIAGNOSIS — Z3682 Encounter for antenatal screening for nuchal translucency: Secondary | ICD-10-CM

## 2016-09-08 DIAGNOSIS — Z363 Encounter for antenatal screening for malformations: Secondary | ICD-10-CM

## 2016-09-08 DIAGNOSIS — Z3482 Encounter for supervision of other normal pregnancy, second trimester: Secondary | ICD-10-CM

## 2016-09-08 LAB — POCT URINALYSIS DIPSTICK
Glucose, UA: NEGATIVE
KETONES UA: NEGATIVE
LEUKOCYTES UA: NEGATIVE
Nitrite, UA: NEGATIVE
Protein, UA: NEGATIVE
RBC UA: NEGATIVE

## 2016-09-08 NOTE — Progress Notes (Signed)
Low-risk OB appointment G2P0010 5036w3d Estimated Date of Delivery: 02/20/17 BP (!) 132/58   Pulse 84   Wt 247 lb (112 kg)   LMP 05/16/2016 (Approximate)   BMI 34.45 kg/m   BP, weight, and urine reviewed.  Refer to obstetrical flow sheet for FH & FHR.  No fm yet. Denies cramping, lof, vb, or uti s/s. No complaints. Still itching, bought cocoa butter but is more like lotion and not thick like ointment, can also try eucerin or aquaphor, bile acids were normal last visit Reviewed warning s/s to report. Plan:  Continue routine obstetrical care  F/U in 4wks for OB appointment and anatomy u/s 2nd IT today

## 2016-09-08 NOTE — Patient Instructions (Signed)
Second Trimester of Pregnancy The second trimester is from week 13 through week 28 (months 4 through 6). The second trimester is often a time when you feel your best. Your body has also adjusted to being pregnant, and you begin to feel better physically. Usually, morning sickness has lessened or quit completely, you may have more energy, and you may have an increase in appetite. The second trimester is also a time when the fetus is growing rapidly. At the end of the sixth month, the fetus is about 9 inches long and weighs about 1 pounds. You will likely begin to feel the baby move (quickening) between 18 and 20 weeks of the pregnancy. Body changes during your second trimester Your body continues to go through many changes during your second trimester. The changes vary from woman to woman.  Your weight will continue to increase. You will notice your lower abdomen bulging out.  You may begin to get stretch marks on your hips, abdomen, and breasts.  You may develop headaches that can be relieved by medicines. The medicines should be approved by your health care provider.  You may urinate more often because the fetus is pressing on your bladder.  You may develop or continue to have heartburn as a result of your pregnancy.  You may develop constipation because certain hormones are causing the muscles that push waste through your intestines to slow down.  You may develop hemorrhoids or swollen, bulging veins (varicose veins).  You may have back pain. This is caused by:  Weight gain.  Pregnancy hormones that are relaxing the joints in your pelvis.  A shift in weight and the muscles that support your balance.  Your breasts will continue to grow and they will continue to become tender.  Your gums may bleed and may be sensitive to brushing and flossing.  Dark spots or blotches (chloasma, mask of pregnancy) may develop on your face. This will likely fade after the baby is born.  A dark line  from your belly button to the pubic area (linea nigra) may appear. This will likely fade after the baby is born.  You may have changes in your hair. These can include thickening of your hair, rapid growth, and changes in texture. Some women also have hair loss during or after pregnancy, or hair that feels dry or thin. Your hair will most likely return to normal after your baby is born. What to expect at prenatal visits During a routine prenatal visit:  You will be weighed to make sure you and the fetus are growing normally.  Your blood pressure will be taken.  Your abdomen will be measured to track your baby's growth.  The fetal heartbeat will be listened to.  Any test results from the previous visit will be discussed. Your health care provider may ask you:  How you are feeling.  If you are feeling the baby move.  If you have had any abnormal symptoms, such as leaking fluid, bleeding, severe headaches, or abdominal cramping.  If you are using any tobacco products, including cigarettes, chewing tobacco, and electronic cigarettes.  If you have any questions. Other tests that may be performed during your second trimester include:  Blood tests that check for:  Low iron levels (anemia).  Gestational diabetes (between 24 and 28 weeks).  Rh antibodies. This is to check for a protein on red blood cells (Rh factor).  Urine tests to check for infections, diabetes, or protein in the urine.  An ultrasound to   confirm the proper growth and development of the baby.  An amniocentesis to check for possible genetic problems.  Fetal screens for spina bifida and Down syndrome.  HIV (human immunodeficiency virus) testing. Routine prenatal testing includes screening for HIV, unless you choose not to have this test. Follow these instructions at home: Eating and drinking  Continue to eat regular, healthy meals.  Avoid raw meat, uncooked cheese, cat litter boxes, and soil used by cats. These  carry germs that can cause birth defects in the baby.  Take your prenatal vitamins.  Take 1500-2000 mg of calcium daily starting at the 20th week of pregnancy until you deliver your baby.  If you develop constipation:  Take over-the-counter or prescription medicines.  Drink enough fluid to keep your urine clear or pale yellow.  Eat foods that are high in fiber, such as fresh fruits and vegetables, whole grains, and beans.  Limit foods that are high in fat and processed sugars, such as fried and sweet foods. Activity  Exercise only as directed by your health care provider. Experiencing uterine cramps is a good sign to stop exercising.  Avoid heavy lifting, wear low heel shoes, and practice good posture.  Wear your seat belt at all times when driving.  Rest with your legs elevated if you have leg cramps or low back pain.  Wear a good support bra for breast tenderness.  Do not use hot tubs, steam rooms, or saunas. Lifestyle  Avoid all smoking, herbs, alcohol, and unprescribed drugs. These chemicals affect the formation and growth of the baby.  Do not use any products that contain nicotine or tobacco, such as cigarettes and e-cigarettes. If you need help quitting, ask your health care provider.  A sexual relationship may be continued unless your health care provider directs you otherwise. General instructions  Follow your health care provider's instructions regarding medicine use. There are medicines that are either safe or unsafe to take during pregnancy.  Take warm sitz baths to soothe any pain or discomfort caused by hemorrhoids. Use hemorrhoid cream if your health care provider approves.  If you develop varicose veins, wear support hose. Elevate your feet for 15 minutes, 3-4 times a day. Limit salt in your diet.  Visit your dentist if you have not gone yet during your pregnancy. Use a soft toothbrush to brush your teeth and be gentle when you floss.  Keep all follow-up  prenatal visits as told by your health care provider. This is important. Contact a health care provider if:  You have dizziness.  You have mild pelvic cramps, pelvic pressure, or nagging pain in the abdominal area.  You have persistent nausea, vomiting, or diarrhea.  You have a bad smelling vaginal discharge.  You have pain with urination. Get help right away if:  You have a fever.  You are leaking fluid from your vagina.  You have spotting or bleeding from your vagina.  You have severe abdominal cramping or pain.  You have rapid weight gain or weight loss.  You have shortness of breath with chest pain.  You notice sudden or extreme swelling of your face, hands, ankles, feet, or legs.  You have not felt your baby move in over an hour.  You have severe headaches that do not go away with medicine.  You have vision changes. Summary  The second trimester is from week 13 through week 28 (months 4 through 6). It is also a time when the fetus is growing rapidly.  Your body goes   through many changes during pregnancy. The changes vary from woman to woman.  Avoid all smoking, herbs, alcohol, and unprescribed drugs. These chemicals affect the formation and growth your baby.  Do not use any tobacco products, such as cigarettes, chewing tobacco, and e-cigarettes. If you need help quitting, ask your health care provider.  Contact your health care provider if you have any questions. Keep all prenatal visits as told by your health care provider. This is important. This information is not intended to replace advice given to you by your health care provider. Make sure you discuss any questions you have with your health care provider. Document Released: 07/12/2001 Document Revised: 12/24/2015 Document Reviewed: 09/18/2012 Elsevier Interactive Patient Education  2017 [employer]  

## 2016-09-14 LAB — MATERNAL SCREEN, INTEGRATED #2
AFP MARKER: 44.7 ng/mL
AFP MOM: 2
Crown Rump Length: 65 mm
DIA MoM: 1.85
DIA Value: 241.9 pg/mL
Estriol, Unconjugated: 0.99 ng/mL
GEST. AGE ON COLLECTION DATE: 12.7 wk
Gestational Age: 16.7 weeks
MATERNAL AGE AT EDD: 30.3 a
Nuchal Translucency (NT): 1.6 mm
Nuchal Translucency MoM: 0.98
Number of Fetuses: 1
PAPP-A MoM: 0.49
PAPP-A VALUE: 277.9 ng/mL
Test Results:: NEGATIVE
WEIGHT: 244 [lb_av]
WEIGHT: 247 [lb_av]
hCG MoM: 1.93
hCG Value: 39.4 IU/mL
uE3 MoM: 1.23

## 2016-10-06 ENCOUNTER — Encounter: Payer: Self-pay | Admitting: Advanced Practice Midwife

## 2016-10-06 ENCOUNTER — Ambulatory Visit (INDEPENDENT_AMBULATORY_CARE_PROVIDER_SITE_OTHER): Payer: BLUE CROSS/BLUE SHIELD

## 2016-10-06 ENCOUNTER — Ambulatory Visit (INDEPENDENT_AMBULATORY_CARE_PROVIDER_SITE_OTHER): Payer: BLUE CROSS/BLUE SHIELD | Admitting: Advanced Practice Midwife

## 2016-10-06 VITALS — BP 136/62 | HR 68 | Wt 247.0 lb

## 2016-10-06 DIAGNOSIS — O358XX1 Maternal care for other (suspected) fetal abnormality and damage, fetus 1: Secondary | ICD-10-CM

## 2016-10-06 DIAGNOSIS — Z3482 Encounter for supervision of other normal pregnancy, second trimester: Secondary | ICD-10-CM

## 2016-10-06 DIAGNOSIS — Z363 Encounter for antenatal screening for malformations: Secondary | ICD-10-CM | POA: Diagnosis not present

## 2016-10-06 DIAGNOSIS — O358XX0 Maternal care for other (suspected) fetal abnormality and damage, not applicable or unspecified: Secondary | ICD-10-CM

## 2016-10-06 DIAGNOSIS — Z331 Pregnant state, incidental: Secondary | ICD-10-CM

## 2016-10-06 DIAGNOSIS — O283 Abnormal ultrasonic finding on antenatal screening of mother: Secondary | ICD-10-CM

## 2016-10-06 DIAGNOSIS — Z3A20 20 weeks gestation of pregnancy: Secondary | ICD-10-CM

## 2016-10-06 DIAGNOSIS — F17200 Nicotine dependence, unspecified, uncomplicated: Secondary | ICD-10-CM

## 2016-10-06 DIAGNOSIS — Z1389 Encounter for screening for other disorder: Secondary | ICD-10-CM

## 2016-10-06 DIAGNOSIS — O35EXX0 Maternal care for other (suspected) fetal abnormality and damage, fetal genitourinary anomalies, not applicable or unspecified: Secondary | ICD-10-CM

## 2016-10-06 LAB — POCT URINALYSIS DIPSTICK
Blood, UA: NEGATIVE
Glucose, UA: NEGATIVE
Ketones, UA: NEGATIVE
Leukocytes, UA: NEGATIVE
Nitrite, UA: NEGATIVE
Protein, UA: NEGATIVE

## 2016-10-06 NOTE — Progress Notes (Signed)
G2P0010 3184w3d Estimated Date of Delivery: 02/20/17  Last menstrual period 05/16/2016.   BP weight and urine results all reviewed and noted.  Please refer to the obstetrical flow sheet for the fundal height and fetal heart rate documentation:  US 20+3 wks,cephalic,cx 4.5 cm,post pl gr 0,normal ov's bilat,svp of fluid 5.3 cm,fhr 136 bpm,bilat pyelectasis,LK 5.3 mm,RK 4 mm,EFW 434 g ,anatomy complete  Patient reports good fetal movement, denies any bleeding and no rupture of membranes symptoms or regular contractions. Patient is without complaints. All questions were answered.  Orders Placed This Encounter  Procedures  . POCT urinalysis dipstick    Plan:  Continued routine obstetrical care,   Return in about 4 weeks (around 11/03/2016) for LROB.

## 2016-10-06 NOTE — Progress Notes (Signed)
US 20+3 wks,cephalic,cx 4.5 cm,post pl gr 0,normal ov's bilat,svp of fluid 5.3 cm,fhr 136 bpm,bilat pyelectasis,LK 5.3 mm,RK 4 mm,EFW 434 g ,anatomy complete

## 2016-10-06 NOTE — Patient Instructions (Addendum)
Second Trimester of Pregnancy The second trimester is from week 14 through week 27 (months 4 through 6). The second trimester is often a time when you feel your best. Your body has adjusted to being pregnant, and you begin to feel better physically. Usually, morning sickness has lessened or quit completely, you may have more energy, and you may have an increase in appetite. The second trimester is also a time when the fetus is growing rapidly. At the end of the sixth month, the fetus is about 9 inches long and weighs about 1 pounds. You will likely begin to feel the baby move (quickening) between 16 and 20 weeks of pregnancy. Body changes during your second trimester Your body continues to go through many changes during your second trimester. The changes vary from woman to woman.  Your weight will continue to increase. You will notice your lower abdomen bulging out.  You may begin to get stretch marks on your hips, abdomen, and breasts.  You may develop headaches that can be relieved by medicines. The medicines should be approved by your health care provider.  You may urinate more often because the fetus is pressing on your bladder.  You may develop or continue to have heartburn as a result of your pregnancy.  You may develop constipation because certain hormones are causing the muscles that push waste through your intestines to slow down.  You may develop hemorrhoids or swollen, bulging veins (varicose veins).  You may have back pain. This is caused by:  Weight gain.  Pregnancy hormones that are relaxing the joints in your pelvis.  A shift in weight and the muscles that support your balance.  Your breasts will continue to grow and they will continue to become tender.  Your gums may bleed and may be sensitive to brushing and flossing.  Dark spots or blotches (chloasma, mask of pregnancy) may develop on your face. This will likely fade after the baby is born.  A dark line from your  belly button to the pubic area (linea nigra) may appear. This will likely fade after the baby is born.  You may have changes in your hair. These can include thickening of your hair, rapid growth, and changes in texture. Some women also have hair loss during or after pregnancy, or hair that feels dry or thin. Your hair will most likely return to normal after your baby is born. What to expect at prenatal visits During a routine prenatal visit:  You will be weighed to make sure you and the fetus are growing normally.  Your blood pressure will be taken.  Your abdomen will be measured to track your baby's growth.  The fetal heartbeat will be listened to.  Any test results from the previous visit will be discussed. Your health care provider may ask you:  How you are feeling.  If you are feeling the baby move.  If you have had any abnormal symptoms, such as leaking fluid, bleeding, severe headaches, or abdominal cramping.  If you are using any tobacco products, including cigarettes, chewing tobacco, and electronic cigarettes.  If you have any questions. Other tests that may be performed during your second trimester include:  Blood tests that check for:  Low iron levels (anemia).  High blood sugar that affects pregnant women (gestational diabetes) between 24 and 28 weeks.  Rh antibodies. This is to check for a protein on red blood cells (Rh factor).  Urine tests to check for infections, diabetes, or protein in the   urine.  An ultrasound to confirm the proper growth and development of the baby.  An amniocentesis to check for possible genetic problems.  Fetal screens for spina bifida and Down syndrome.  HIV (human immunodeficiency virus) testing. Routine prenatal testing includes screening for HIV, unless you choose not to have this test. Follow these instructions at home: Medicines   Follow your health care provider's instructions regarding medicine use. Specific medicines may  be either safe or unsafe to take during pregnancy.  Take a prenatal vitamin that contains at least 600 micrograms (mcg) of folic acid.  If you develop constipation, try taking a stool softener if your health care provider approves. Eating and drinking   Eat a balanced diet that includes fresh fruits and vegetables, whole grains, good sources of protein such as meat, eggs, or tofu, and low-fat dairy. Your health care provider will help you determine the amount of weight gain that is right for you.  Avoid raw meat and uncooked cheese. These carry germs that can cause birth defects in the baby.  If you have low calcium intake from food, talk to your health care provider about whether you should take a daily calcium supplement.  Limit foods that are high in fat and processed sugars, such as fried and sweet foods.  To prevent constipation:  Drink enough fluid to keep your urine clear or pale yellow.  Eat foods that are high in fiber, such as fresh fruits and vegetables, whole grains, and beans. Activity   Exercise only as directed by your health care provider. Most women can continue their usual exercise routine during pregnancy. Try to exercise for 30 minutes at least 5 days a week. Stop exercising if you experience uterine contractions.  Avoid heavy lifting, wear low heel shoes, and practice good posture.  A sexual relationship may be continued unless your health care provider directs you otherwise. Relieving pain and discomfort   Wear a good support bra to prevent discomfort from breast tenderness.  Take warm sitz baths to soothe any pain or discomfort caused by hemorrhoids. Use hemorrhoid cream if your health care provider approves.  Rest with your legs elevated if you have leg cramps or low back pain.  If you develop varicose veins, wear support hose. Elevate your feet for 15 minutes, 3-4 times a day. Limit salt in your diet. Prenatal Care   Write down your questions. Take them  to your prenatal visits.  Keep all your prenatal visits as told by your health care provider. This is important. Safety   Wear your seat belt at all times when driving.  Make a list of emergency phone numbers, including numbers for family, friends, the hospital, and police and fire departments. General instructions   Ask your health care provider for a referral to a local prenatal education class. Begin classes no later than the beginning of month 6 of your pregnancy.  Ask for help if you have counseling or nutritional needs during pregnancy. Your health care provider can offer advice or refer you to specialists for help with various needs.  Do not use hot tubs, steam rooms, or saunas.  Do not douche or use tampons or scented sanitary pads.  Do not cross your legs for long periods of time.  Avoid cat litter boxes and soil used by cats. These carry germs that can cause birth defects in the baby and possibly loss of the fetus by miscarriage or stillbirth.  Avoid all smoking, herbs, alcohol, and unprescribed drugs. Chemicals in   these products can affect the formation and growth of the baby.  Do not use any products that contain nicotine or tobacco, such as cigarettes and e-cigarettes. If you need help quitting, ask your health care provider.  Visit your dentist if you have not gone yet during your pregnancy. Use a soft toothbrush to brush your teeth and be gentle when you floss. Contact a health care provider if:  You have dizziness.  You have mild pelvic cramps, pelvic pressure, or nagging pain in the abdominal area.  You have persistent nausea, vomiting, or diarrhea.  You have a bad smelling vaginal discharge.  You have pain when you urinate. Get help right away if:  You have a fever.  You are leaking fluid from your vagina.  You have spotting or bleeding from your vagina.  You have severe abdominal cramping or pain.  You have rapid weight gain or weight loss.  You  have shortness of breath with chest pain.  You notice sudden or extreme swelling of your face, hands, ankles, feet, or legs.  You have not felt your baby move in over an hour.  You have severe headaches that do not go away when you take medicine.  You have vision changes. Summary  The second trimester is from week 14 through week 27 (months 4 through 6). It is also a time when the fetus is growing rapidly.  Your body goes through many changes during pregnancy. The changes vary from woman to woman.  Avoid all smoking, herbs, alcohol, and unprescribed drugs. These chemicals affect the formation and growth your baby.  Do not use any tobacco products, such as cigarettes, chewing tobacco, and e-cigarettes. If you need help quitting, ask your health care provider.  Contact your health care provider if you have any questions. Keep all prenatal visits as told by your health care provider. This is important. This information is not intended to replace advice given to you by your health care provider. Make sure you discuss any questions you have with your health care provider. Document Released: 07/12/2001 Document Revised: 12/24/2015 Document Reviewed: 09/18/2012 Elsevier Interactive Patient Education  2017 [employer].   [place] [facility]/Family Doctors:  [facility] [phone]            [facility] [phone]                 [facility] [phone] (usually not accepting new patients unless you have family there already, you are always welcome to call and ask)            [facility] ([address] [place]) [phone]   [facility]:   [facility]: [phone]  [facility]: [phone]

## 2016-11-03 ENCOUNTER — Encounter: Payer: Self-pay | Admitting: Women's Health

## 2016-11-03 ENCOUNTER — Ambulatory Visit (INDEPENDENT_AMBULATORY_CARE_PROVIDER_SITE_OTHER): Payer: BLUE CROSS/BLUE SHIELD | Admitting: Women's Health

## 2016-11-03 VITALS — BP 128/64 | HR 80 | Wt 255.0 lb

## 2016-11-03 DIAGNOSIS — O35EXX0 Maternal care for other (suspected) fetal abnormality and damage, fetal genitourinary anomalies, not applicable or unspecified: Secondary | ICD-10-CM

## 2016-11-03 DIAGNOSIS — O358XX0 Maternal care for other (suspected) fetal abnormality and damage, not applicable or unspecified: Secondary | ICD-10-CM

## 2016-11-03 DIAGNOSIS — Z331 Pregnant state, incidental: Secondary | ICD-10-CM

## 2016-11-03 DIAGNOSIS — Z1389 Encounter for screening for other disorder: Secondary | ICD-10-CM

## 2016-11-03 DIAGNOSIS — Z3482 Encounter for supervision of other normal pregnancy, second trimester: Secondary | ICD-10-CM

## 2016-11-03 LAB — POCT URINALYSIS DIPSTICK
Glucose, UA: NEGATIVE
Ketones, UA: NEGATIVE
Leukocytes, UA: NEGATIVE
Nitrite, UA: NEGATIVE
Protein, UA: NEGATIVE
RBC UA: NEGATIVE

## 2016-11-03 NOTE — Patient Instructions (Signed)
You will have your sugar test next visit.  Please do not eat or drink anything after midnight the night before you come, not even water.  You will be here for at least two hours.     Call the office [phone]) or go to [facility] if:  You begin to have strong, frequent contractions  Your water breaks.  Sometimes it is a big gush of fluid, sometimes it is just a trickle that keeps getting your panties wet or running down your legs  You have vaginal bleeding.  It is normal to have a small amount of spotting if your cervix was checked.   You don't feel your baby moving like normal.  If you don't, get you something to eat and drink and lay down and focus on feeling your baby move.   If your baby is still not moving like normal, you should call the office or go to [facility].  Offices that do circumcisions: [facility] [phone] [facility]) $244 within 4 weeks of birth, [facility] [phone] [facility]) $250 within 7 days of birth, [facility] [phone] [facility]) $175 within 2 weeks of birth   Second Trimester of Pregnancy The second trimester is from week 13 through week 28, months 4 through 6. The second trimester is often a time when you feel your best. Your body has also adjusted to being pregnant, and you begin to feel better physically. Usually, morning sickness has lessened or quit completely, you may have more energy, and you may have an increase in appetite. The second trimester is also a time when the fetus is growing rapidly. At the end of the sixth month, the fetus is about 9 inches long and weighs about 1 pounds. You will likely begin to feel the baby move (quickening) between 18 and 20 weeks of the pregnancy. BODY CHANGES Your body goes through many changes during pregnancy. The changes vary from woman to woman.   Your weight will continue to increase. You will notice your lower abdomen bulging out.  You may begin to get stretch marks on your hips,  abdomen, and breasts.  You may develop headaches that can be relieved by medicines approved by your health care provider.  You may urinate more often because the fetus is pressing on your bladder.  You may develop or continue to have heartburn as a result of your pregnancy.  You may develop constipation because certain hormones are causing the muscles that push waste through your intestines to slow down.  You may develop hemorrhoids or swollen, bulging veins (varicose veins).  You may have back pain because of the weight gain and pregnancy hormones relaxing your joints between the bones in your pelvis and as a result of a shift in weight and the muscles that support your balance.  Your breasts will continue to grow and be tender.  Your gums may bleed and may be sensitive to brushing and flossing.  Dark spots or blotches (chloasma, mask of pregnancy) may develop on your face. This will likely fade after the baby is born.  A dark line from your belly button to the pubic area (linea nigra) may appear. This will likely fade after the baby is born.  You may have changes in your hair. These can include thickening of your hair, rapid growth, and changes in texture. Some women also have hair loss during or after pregnancy, or hair that feels dry or thin. Your hair will most likely return to normal after your baby  is born. WHAT TO EXPECT AT YOUR PRENATAL VISITS During a routine prenatal visit:  You will be weighed to make sure you and the fetus are growing normally.  Your blood pressure will be taken.  Your abdomen will be measured to track your baby's growth.  The fetal heartbeat will be listened to.  Any test results from the previous visit will be discussed. Your health care provider may ask you:  How you are feeling.  If you are feeling the baby move.  If you have had any abnormal symptoms, such as leaking fluid, bleeding, severe headaches, or abdominal cramping.  If you have  any questions. Other tests that may be performed during your second trimester include:  Blood tests that check for:  Low iron levels (anemia).  Gestational diabetes (between 24 and 28 weeks).  Rh antibodies.  Urine tests to check for infections, diabetes, or protein in the urine.  An ultrasound to confirm the proper growth and development of the baby.  An amniocentesis to check for possible genetic problems.  Fetal screens for spina bifida and Down syndrome. HOME CARE INSTRUCTIONS   Avoid all smoking, herbs, alcohol, and unprescribed drugs. These chemicals affect the formation and growth of the baby.  Follow your health care provider's instructions regarding medicine use. There are medicines that are either safe or unsafe to take during pregnancy.  Exercise only as directed by your health care provider. Experiencing uterine cramps is a good sign to stop exercising.  Continue to eat regular, healthy meals.  Wear a good support bra for breast tenderness.  Do not use hot tubs, steam rooms, or saunas.  Wear your seat belt at all times when driving.  Avoid raw meat, uncooked cheese, cat litter boxes, and soil used by cats. These carry germs that can cause birth defects in the baby.  Take your prenatal vitamins.  Try taking a stool softener (if your health care provider approves) if you develop constipation. Eat more high-fiber foods, such as fresh vegetables or fruit and whole grains. Drink plenty of fluids to keep your urine clear or pale yellow.  Take warm sitz baths to soothe any pain or discomfort caused by hemorrhoids. Use hemorrhoid cream if your health care provider approves.  If you develop varicose veins, wear support hose. Elevate your feet for 15 minutes, 3-4 times a day. Limit salt in your diet.  Avoid heavy lifting, wear low heel shoes, and practice good posture.  Rest with your legs elevated if you have leg cramps or low back pain.  Visit your dentist if you  have not gone yet during your pregnancy. Use a soft toothbrush to brush your teeth and be gentle when you floss.  A sexual relationship may be continued unless your health care provider directs you otherwise.  Continue to go to all your prenatal visits as directed by your health care provider. SEEK MEDICAL CARE IF:   You have dizziness.  You have mild pelvic cramps, pelvic pressure, or nagging pain in the abdominal area.  You have persistent nausea, vomiting, or diarrhea.  You have a bad smelling vaginal discharge.  You have pain with urination. SEEK IMMEDIATE MEDICAL CARE IF:   You have a fever.  You are leaking fluid from your vagina.  You have spotting or bleeding from your vagina.  You have severe abdominal cramping or pain.  You have rapid weight gain or loss.  You have shortness of breath with chest pain.  You notice sudden or extreme swelling  of your face, hands, ankles, feet, or legs.  You have not felt your baby move in over an hour.  You have severe headaches that do not go away with medicine.  You have vision changes. Document Released: 07/12/2001 Document Revised: 07/23/2013 Document Reviewed: 09/18/2012 [facility] Patient Information 2015 [place], [place]. This information is not intended to replace advice given to you by your health care provider. Make sure you discuss any questions you have with your health care provider.

## 2016-11-03 NOTE — Progress Notes (Signed)
Low-risk OB appointment G2P0010 [redacted]w[redacted]d Estimated Date of Delivery: 02/20/17 BP 128/64   Pulse 80   Wt 255 lb (115.7 kg)   LMP 05/16/2016 (Approximate)   BMI 35.57 kg/m   BP, weight, and urine reviewed.  Refer to obstetrical flow sheet for FH & FHR.  Reports good fm.  Denies regular uc's, lof, vb, or uti s/s. Swelling legs. Discussed elevation, compression stockings.  Reviewed ptl s/s, fm. Plan:  Continue routine obstetrical care  F/U in 4wks for OB appointment, pn2, and f/u u/s for pyelectasis

## 2016-12-01 ENCOUNTER — Ambulatory Visit (INDEPENDENT_AMBULATORY_CARE_PROVIDER_SITE_OTHER): Payer: BLUE CROSS/BLUE SHIELD | Admitting: Women's Health

## 2016-12-01 ENCOUNTER — Other Ambulatory Visit: Payer: BLUE CROSS/BLUE SHIELD

## 2016-12-01 ENCOUNTER — Ambulatory Visit (INDEPENDENT_AMBULATORY_CARE_PROVIDER_SITE_OTHER): Payer: BLUE CROSS/BLUE SHIELD

## 2016-12-01 ENCOUNTER — Encounter: Payer: Self-pay | Admitting: Women's Health

## 2016-12-01 VITALS — BP 108/58 | HR 101 | Wt 256.0 lb

## 2016-12-01 DIAGNOSIS — Z1389 Encounter for screening for other disorder: Secondary | ICD-10-CM

## 2016-12-01 DIAGNOSIS — O358XX0 Maternal care for other (suspected) fetal abnormality and damage, not applicable or unspecified: Secondary | ICD-10-CM

## 2016-12-01 DIAGNOSIS — Z3482 Encounter for supervision of other normal pregnancy, second trimester: Secondary | ICD-10-CM

## 2016-12-01 DIAGNOSIS — Z3483 Encounter for supervision of other normal pregnancy, third trimester: Secondary | ICD-10-CM

## 2016-12-01 DIAGNOSIS — F17200 Nicotine dependence, unspecified, uncomplicated: Secondary | ICD-10-CM

## 2016-12-01 DIAGNOSIS — O35EXX0 Maternal care for other (suspected) fetal abnormality and damage, fetal genitourinary anomalies, not applicable or unspecified: Secondary | ICD-10-CM

## 2016-12-01 DIAGNOSIS — Z3A28 28 weeks gestation of pregnancy: Secondary | ICD-10-CM

## 2016-12-01 DIAGNOSIS — Z131 Encounter for screening for diabetes mellitus: Secondary | ICD-10-CM

## 2016-12-01 DIAGNOSIS — Z331 Pregnant state, incidental: Secondary | ICD-10-CM

## 2016-12-01 LAB — POCT URINALYSIS DIPSTICK
Ketones, UA: NEGATIVE
LEUKOCYTES UA: NEGATIVE
NITRITE UA: NEGATIVE
Protein, UA: NEGATIVE

## 2016-12-01 NOTE — Progress Notes (Signed)
Low-risk OB appointment G2P0010 7275w3d Estimated Date of Delivery: 02/20/17 BP (!) 108/58   Pulse (!) 101   Wt 256 lb (116.1 kg)   LMP 05/16/2016 (Approximate)   BMI 35.70 kg/m   BP, weight, and urine reviewed.  Refer to obstetrical flow sheet for FH & FHR.  Reports good fm.  Denies regular uc's, lof, vb, or uti s/s. No complaints.  Reviewed today's u/s for f/u pyelectasis- Lt 4.495mm, Rt 4.321mm, AC 99%/EFW 86%, has gained 26lbs of recommended no more than 20lbs based on pre-gravid BMI, advised no further weight gain, cut back carbs/increase exercise. Plan:  Continue routine obstetrical care  F/U in 4wks for OB appointment and u/s for efw/recheck kidneys PN2 today

## 2016-12-01 NOTE — Patient Instructions (Signed)

## 2016-12-01 NOTE — Progress Notes (Signed)
US 28+3 wsk,cephalic,post pl gr 0,normal ovaries bilat,afi 13 cm,fhr 130 bpm,left renal pelvis 4.5 mm,right renal pelvis 4.1 mm (wnl),efw 1656 g 86%,AC 99%

## 2016-12-02 ENCOUNTER — Telehealth: Payer: Self-pay | Admitting: *Deleted

## 2016-12-02 ENCOUNTER — Other Ambulatory Visit: Payer: Self-pay | Admitting: Women's Health

## 2016-12-02 DIAGNOSIS — O24410 Gestational diabetes mellitus in pregnancy, diet controlled: Secondary | ICD-10-CM

## 2016-12-02 DIAGNOSIS — O24419 Gestational diabetes mellitus in pregnancy, unspecified control: Secondary | ICD-10-CM | POA: Insufficient documentation

## 2016-12-02 LAB — HIV ANTIBODY (ROUTINE TESTING W REFLEX): HIV Screen 4th Generation wRfx: NONREACTIVE

## 2016-12-02 LAB — CBC
Hematocrit: 33.3 % — ABNORMAL LOW (ref 34.0–46.6)
Hemoglobin: 11.2 g/dL (ref 11.1–15.9)
MCH: 30.2 pg (ref 26.6–33.0)
MCHC: 33.6 g/dL (ref 31.5–35.7)
MCV: 90 fL (ref 79–97)
PLATELETS: 244 10*3/uL (ref 150–379)
RBC: 3.71 x10E6/uL — ABNORMAL LOW (ref 3.77–5.28)
RDW: 13.6 % (ref 12.3–15.4)
WBC: 12.8 10*3/uL — ABNORMAL HIGH (ref 3.4–10.8)

## 2016-12-02 LAB — GLUCOSE TOLERANCE, 2 HOURS W/ 1HR
GLUCOSE, FASTING: 91 mg/dL (ref 65–91)
Glucose, 1 hour: 184 mg/dL — ABNORMAL HIGH (ref 65–179)
Glucose, 2 hour: 114 mg/dL (ref 65–152)

## 2016-12-02 LAB — RPR: RPR Ser Ql: NONREACTIVE

## 2016-12-02 LAB — ANTIBODY SCREEN: Antibody Screen: NEGATIVE

## 2016-12-02 NOTE — Telephone Encounter (Signed)
LMVOM that she has gestational diabetes. A referral was placed to dietician, she should get a call from them within the next week to schedule appointment. She will be checking blood sugars 4 times a day, bring log to each appointment.  Advised to call me if she has not heard from them by the end of next week.

## 2016-12-14 ENCOUNTER — Encounter: Payer: BLUE CROSS/BLUE SHIELD | Attending: Obstetrics and Gynecology | Admitting: Registered"

## 2016-12-14 DIAGNOSIS — Z3A00 Weeks of gestation of pregnancy not specified: Secondary | ICD-10-CM | POA: Insufficient documentation

## 2016-12-14 DIAGNOSIS — O24410 Gestational diabetes mellitus in pregnancy, diet controlled: Secondary | ICD-10-CM | POA: Diagnosis not present

## 2016-12-14 DIAGNOSIS — R7309 Other abnormal glucose: Secondary | ICD-10-CM

## 2016-12-19 ENCOUNTER — Encounter: Payer: Self-pay | Admitting: Registered"

## 2016-12-19 NOTE — Progress Notes (Signed)
Patient was seen on 12/14/2016 for Gestational Diabetes self-management class at the [facility]. The following learning objectives were met by the patient during this course:   States the definition of Gestational Diabetes  States why dietary management is important in controlling blood glucose  Describes the effects each nutrient has on blood glucose levels  Demonstrates ability to create a balanced meal plan  Demonstrates carbohydrate counting   States when to check blood glucose levels  Demonstrates proper blood glucose monitoring techniques  States the effect of stress and exercise on blood glucose levels  States the importance of limiting caffeine and abstaining from alcohol and smoking  Blood glucose monitor given: Accu-chek Guide Lot # [id number] Exp: 09/16/2017 Blood glucose reading: 73  Patient instructed to monitor glucose levels: FBS: 60 - <90 1 hour: <140 2 hour: <120  Patient received handouts:  Nutrition Diabetes and Pregnancy  Carbohydrate Counting List  Patient will be seen for follow-up as needed.

## 2016-12-29 ENCOUNTER — Encounter: Payer: Self-pay | Admitting: Women's Health

## 2016-12-29 ENCOUNTER — Ambulatory Visit (INDEPENDENT_AMBULATORY_CARE_PROVIDER_SITE_OTHER): Payer: BLUE CROSS/BLUE SHIELD | Admitting: Women's Health

## 2016-12-29 ENCOUNTER — Ambulatory Visit (INDEPENDENT_AMBULATORY_CARE_PROVIDER_SITE_OTHER): Payer: BLUE CROSS/BLUE SHIELD

## 2016-12-29 VITALS — BP 122/76 | HR 76 | Wt 248.0 lb

## 2016-12-29 DIAGNOSIS — O35EXX0 Maternal care for other (suspected) fetal abnormality and damage, fetal genitourinary anomalies, not applicable or unspecified: Secondary | ICD-10-CM

## 2016-12-29 DIAGNOSIS — O0990 Supervision of high risk pregnancy, unspecified, unspecified trimester: Secondary | ICD-10-CM

## 2016-12-29 DIAGNOSIS — O0993 Supervision of high risk pregnancy, unspecified, third trimester: Secondary | ICD-10-CM

## 2016-12-29 DIAGNOSIS — O24419 Gestational diabetes mellitus in pregnancy, unspecified control: Secondary | ICD-10-CM

## 2016-12-29 DIAGNOSIS — Z6791 Unspecified blood type, Rh negative: Secondary | ICD-10-CM | POA: Diagnosis not present

## 2016-12-29 DIAGNOSIS — O358XX0 Maternal care for other (suspected) fetal abnormality and damage, not applicable or unspecified: Secondary | ICD-10-CM

## 2016-12-29 DIAGNOSIS — Z1389 Encounter for screening for other disorder: Secondary | ICD-10-CM

## 2016-12-29 DIAGNOSIS — F17200 Nicotine dependence, unspecified, uncomplicated: Secondary | ICD-10-CM

## 2016-12-29 DIAGNOSIS — Z331 Pregnant state, incidental: Secondary | ICD-10-CM

## 2016-12-29 DIAGNOSIS — O26899 Other specified pregnancy related conditions, unspecified trimester: Secondary | ICD-10-CM

## 2016-12-29 LAB — POCT URINALYSIS DIPSTICK
GLUCOSE UA: NEGATIVE
Ketones, UA: NEGATIVE
LEUKOCYTES UA: NEGATIVE
Nitrite, UA: NEGATIVE
Protein, UA: NEGATIVE
RBC UA: NEGATIVE

## 2016-12-29 MED ORDER — RHO D IMMUNE GLOBULIN 1500 UNITS IM SOSY
1500.0000 [IU] | PREFILLED_SYRINGE | Freq: Once | INTRAMUSCULAR | Status: AC
  Administered 2016-12-29: 1500 [IU] via INTRAMUSCULAR

## 2016-12-29 MED ORDER — GLYBURIDE 5 MG PO TABS: ORAL_TABLET | ORAL | 3 refills | Status: DC

## 2016-12-29 NOTE — Progress Notes (Signed)
High Risk Pregnancy Diagnosis(es): A2DM- added med today G2P0010 8454w3d Estimated Date of Delivery: 02/20/17 BP 122/76   Pulse 76   Wt 248 lb (112.5 kg)   LMP 05/16/2016 (Approximate)   BMI 34.59 kg/m   Urinalysis: Negative HPI:  fbs 86-105 (all >120 but the one 86), 2hr pp 85-152 (7 >120, majority <120) BP, weight, and urine reviewed.  Reports good fm. Denies regular uc's, lof, vb, uti s/s. No complaints.  Fundal Height:  32 Fetal Heart rate:  129 u/s Edema:  trace  Reviewed today u/s done for recheck efw and bilateral pylectasis- last u/s @ 28wks efw 86% w/ AC 99%. Today efw 76% w/ AC 94% and BPD 90%, Rt renal pelvis 3.166mm, Lt renal pelvis 4.207mm Discussed ptl s/s, fkc All questions were answered Assessment: 5254w3d A2DM- med added today Medication(s) Plans:  rx glyburide 5mg  pm only Treatment Plan:  Growth u/s @ 38wks     Begin 2x/wk nst's    Deliver @ 39wks Follow up on Monday for high-risk OB appt and NST Rhogam today

## 2016-12-29 NOTE — Progress Notes (Signed)
US 32+3 wks,cephalic,post pl gr 1,normal ovaries bilat,afi 19 cm,fhr 129 bpm,right renal pelvis 3.6 mm,left renal pelvis 4.7 mm WNL,EFW 2444 g 76%,AC 94%,BPD 90%

## 2016-12-29 NOTE — Patient Instructions (Signed)
Call the office [phone]) or go to [facility] if:  You begin to have strong, frequent contractions  Your water breaks.  Sometimes it is a big gush of fluid, sometimes it is just a trickle that keeps getting your panties wet or running down your legs  You have vaginal bleeding.  It is normal to have a small amount of spotting if your cervix was checked.   You don't feel your baby moving like normal.  If you don't, get you something to eat and drink and lay down and focus on feeling your baby move.  You should feel at least 10 movements in 2 hours.  If you don't, you should call the office or go to [facility].   Gestational Diabetes Mellitus, Self Care Caring for yourself after you have been diagnosed with gestational diabetes (gestational diabetes mellitus) means keeping your blood sugar (glucose) under control with a balance of:  Nutrition.  Exercise.  Lifestyle changes.  Medicines or insulin, if necessary.  Support from your team of health care providers and others.  The following information explains what you need to know to manage your gestational diabetes at home. What do I need to do to manage my blood glucose?  Check your blood glucose every day during your pregnancy. Do this as often as told by your health care provider.  Contact your health care provider if your blood glucose is above your target for 2 tests in a row. Your health care provider will set individualized treatment goals for you. Generally, the goal of treatment is to maintain the following blood glucose levels during pregnancy:  After not eating for 8 hours (after fasting): at or below 95 mg/dL (5.3 mmol/L).  After meals (postprandial): ? One hour after a meal: at or below 140 mg/dL (7.8 mmol/L). ? Two hours after a meal: at or below 120 mg/dL (6.7 mmol/L).  A1c (hemoglobin A1c) level: 6-6.5%.  What do I need to know about hyperglycemia and hypoglycemia? What is hyperglycemia? Hyperglycemia,  also called high blood glucose, occurs when blood glucose is too high. Make sure you know the early signs of hyperglycemia, such as:  Increased thirst.  Hunger.  Feeling very tired.  Needing to urinate more often than usual.  Blurry vision.  What is hypoglycemia? Hypoglycemia, also called low blood glucose, occurswith a blood glucose level at or below 70 mg/dL (3.9 mmol/L). The risk for hypoglycemia increases during or after exercise, during sleep, during illness, and when skipping meals or not eating for a long time (fasting). It is important to know the symptoms of hypoglycemia and treat it right away. Always have a 15-gram rapid-acting carbohydrate snack with you to treat low blood glucose.Family members and close friends should also know the symptoms and should understand how to treat hypoglycemia, in case you are not able to treat yourself. What are the symptoms of hypoglycemia? Hypoglycemia symptoms can include:  Hunger.  Anxiety.  Sweating and feeling clammy.  Confusion.  Dizziness or feeling light-headed.  Sleepiness.  Nausea.  Increased heart rate.  Headache.  Blurry vision.  Seizure.  Nightmares.  Tingling or numbness around the mouth, lips, or tongue.  A change in speech.  Decreased ability to concentrate.  A change in coordination.  Restless sleep.  Tremors or shakes.  Fainting.  Irritability.  How do I treat hypoglycemia?  If you are alert and able to swallow safely, follow the 15:15 rule:  Take 15 grams of a rapid-acting carbohydrate. Rapid-acting options include: ? 1  tube of glucose gel. ? 3 glucose pills. ? 6-8 pieces of hard candy. ? 4 oz (120 mL) of fruit juice. ? 4 oz (120 mL) of regular (not diet) soda.  Check your blood glucose 15 minutes after you take the carbohydrate.  If the repeat blood glucose level is still at or below 70 mg/dL (3.9 mmol/L), take 15 grams of a carbohydrate again.  If your blood glucose level does  not increase above 70 mg/dL (3.9 mmol/L) after 3 tries, seek emergency medical care.  After your blood glucose level returns to normal, eat a meal or a snack within 1 hour.  How do I treat severe hypoglycemia? Severe hypoglycemia is when your blood glucose level is at or below 54 mg/dL (3 mmol/L). Severe hypoglycemia is an emergency. Do not wait to see if the symptoms will go away. Get medical help right away. Call your local emergency services (911 in the [place] Do not drive yourself to the hospital. If you have severe hypoglycemia and you cannot eat or drink, you may need an injection of glucagon. A family member or close friend should learn how to check your blood glucose and how to give you a glucagon injection. Ask your health care provider if you need to have an emergency glucagon injection kit available. Severe hypoglycemia may need to be treated in a hospital. The treatment may include getting glucose through an IV tube. You may also need treatment for the cause of your hypoglycemia. What else can I do to manage my gestational diabetes? Take your diabetes medicines as told  If your health care provider prescribed insulin or diabetes medicines, take them every day.  Do not run out of insulin or other diabetes medicines that you take. Plan ahead so you always have these available.  If you use insulin, adjust your dosage based on how physically active you are and what foods you eat. Your health care provider will tell you how to adjust your dosage. Make healthy food choices  The things that you eat and drink affect your blood glucose. Making good choices helps to control your diabetes and prevent other health problems. A healthy meal plan includes eating lean proteins, complex carbohydrates, fresh fruits and vegetables, low-fat dairy products, and healthy fats. Make an appointment to see a diet and nutrition specialist (registered dietitian) to help you create an eating plan that is right for  you. Make sure that you:  Follow instructions from your health care provider about eating or drinking restrictions.  Drink enough fluid to keep your urine clear or pale yellow.  Eat healthy snacks between nutritious meals.  Track the carbohydrates that you eat. Do this by reading food labels and learning the standard serving sizes of foods.  Follow your sick day plan whenever you cannot eat or drink as usual. Make this plan in advance with your health care provider.  Stay active   Do at least 30 minutes of physical activity a day, or as much physical activity as your health care provider recommends during your pregnancy. ? Doing 10 minutes of exercise 30 minutes after each meal may help to control postprandial blood glucose levels.  If you start a new exercise or activity, work with your health care provider to adjust your insulin, medicines, or food intake as needed. Make healthy lifestyle choices  Do not drink alcohol.  Do not use any tobacco products, such as cigarettes, chewing tobacco, and e-cigarettes. If you need help quitting, ask your health care provider.  Learn to manage stress. If you need help with this, ask your health care provider. Care for your body  Keep your immunizations up to date.  Brush your teeth and gums two times a day, and floss at least one time a day.  Visit your dentist at least once every 6 months.  Maintain a healthy weight during your pregnancy. General instructions   Take over-the-counter and prescription medicines only as told by your health care provider.  Talk with your health care provider about your risk for high blood pressure during pregnancy (preeclampsia or eclampsia).  Share your diabetes management plan with people in your workplace, school, and household.  Check your urine for ketones during your pregnancy when you are ill and as told by your health care provider.  Carry a medical alert card or wear medical alert  jewelry.  Ask your health care provider: ? Do I need to meet with a diabetes educator? ? Where can I find a support group for people with diabetes?  Keep all follow-up visits during your pregnancy (prenatal) and after delivery (postnatal) as told by your health care provider. This is important. Get the care that you need after delivery  Have your blood glucose level checked 4-12 weeks after delivery. This is done with an oral glucose tolerance test (OGTT).  Get screened for diabetes at least every 3 years, or as often as told by your health care provider. Where to find more information: To learn more about gestational diabetes, visit:  American Diabetes Association (ADA): www.diabetes.org/diabetes-basics/gestational  Centers for Disease Control and Prevention (CDC): http://sanchez-watson.com/.pdf  This information is not intended to replace advice given to you by your health care provider. Make sure you discuss any questions you have with your health care provider. Document Released: 11/09/2015 Document Revised: 12/24/2015 Document Reviewed: 08/21/2015 Elsevier Interactive Patient Education  2017 [employer].

## 2017-01-02 ENCOUNTER — Encounter: Payer: Self-pay | Admitting: Obstetrics & Gynecology

## 2017-01-02 ENCOUNTER — Ambulatory Visit (INDEPENDENT_AMBULATORY_CARE_PROVIDER_SITE_OTHER): Payer: BLUE CROSS/BLUE SHIELD | Admitting: Obstetrics & Gynecology

## 2017-01-02 VITALS — BP 120/70 | HR 78 | Wt 250.0 lb

## 2017-01-02 DIAGNOSIS — O0990 Supervision of high risk pregnancy, unspecified, unspecified trimester: Secondary | ICD-10-CM

## 2017-01-02 DIAGNOSIS — Z331 Pregnant state, incidental: Secondary | ICD-10-CM

## 2017-01-02 DIAGNOSIS — O24419 Gestational diabetes mellitus in pregnancy, unspecified control: Secondary | ICD-10-CM

## 2017-01-02 DIAGNOSIS — Z1389 Encounter for screening for other disorder: Secondary | ICD-10-CM

## 2017-01-02 LAB — POCT URINALYSIS DIPSTICK
Blood, UA: NEGATIVE
Glucose, UA: NEGATIVE
KETONES UA: NEGATIVE
Leukocytes, UA: NEGATIVE
Nitrite, UA: NEGATIVE
PROTEIN UA: 1

## 2017-01-02 NOTE — Progress Notes (Signed)
Fetal Surveillance Testing today:  Reactive NST   High Risk Pregnancy Diagnosis(es):   Class A2 DM  G2P0010 483w0d Estimated Date of Delivery: 02/20/17  Blood pressure 120/70, pulse 78, weight 250 lb (113.4 kg), last menstrual period 05/16/2016.  Urinalysis: Negative   HPI: The patient is being seen today for ongoing management of Class A2 DM. Today she reports CBG are pretty good   BP weight and urine results all reviewed and noted. Patient reports good fetal movement, denies any bleeding and no rupture of membranes symptoms or regular contractions.  Fundal Height:  36 Fetal Heart rate:  135 Edema:  2+  Patient is without complaints other than noted in her HPI. All questions were answered.  All lab and sonogram results have been reviewed. Comments:    Assessment:  1.  Pregnancy at 623w0d,  Estimated Date of Delivery: 02/20/17 :                          2.  Class A2 DM                        3.    Medication(s) Plans:  Glyburide 5 pm  Treatment Plan:  Twice weekly NST with repeat EFW 36-37 weeks  Return in about 3 days (around 01/05/2017) for NST, HROB. for appointment for high risk OB care  No orders of the defined types were placed in this encounter.  Orders Placed This Encounter  Procedures  . POCT urinalysis dipstick

## 2017-01-05 ENCOUNTER — Encounter: Payer: Self-pay | Admitting: Obstetrics and Gynecology

## 2017-01-05 ENCOUNTER — Ambulatory Visit (INDEPENDENT_AMBULATORY_CARE_PROVIDER_SITE_OTHER): Payer: BLUE CROSS/BLUE SHIELD | Admitting: Obstetrics and Gynecology

## 2017-01-05 VITALS — BP 122/60 | HR 76 | Wt 250.8 lb

## 2017-01-05 DIAGNOSIS — O0993 Supervision of high risk pregnancy, unspecified, third trimester: Secondary | ICD-10-CM

## 2017-01-05 DIAGNOSIS — O0990 Supervision of high risk pregnancy, unspecified, unspecified trimester: Secondary | ICD-10-CM

## 2017-01-05 DIAGNOSIS — O24419 Gestational diabetes mellitus in pregnancy, unspecified control: Secondary | ICD-10-CM

## 2017-01-05 DIAGNOSIS — Z1389 Encounter for screening for other disorder: Secondary | ICD-10-CM

## 2017-01-05 DIAGNOSIS — Z331 Pregnant state, incidental: Secondary | ICD-10-CM

## 2017-01-05 LAB — POCT URINALYSIS DIPSTICK
Blood, UA: NEGATIVE
GLUCOSE UA: NEGATIVE
Ketones, UA: NEGATIVE
LEUKOCYTES UA: NEGATIVE
Nitrite, UA: NEGATIVE
PROTEIN UA: NEGATIVE

## 2017-01-05 NOTE — Progress Notes (Signed)
Patient ID: Deborah Taylor, female   DOB: 07-06-1986, 31 y.o.   MRN: [id number]   High Risk Pregnancy HROB Diagnosis(es):   Class A2 DM  G2P0010 4964w3d Estimated Date of Delivery: 02/20/17     HPI: The patient is being seen today for ongoing management of the above.  Chief Complaint  Patient presents with  . Routine Prenatal Visit    NST  ____  Today she reports she is doing well Blood sugar review: Fastings all 80-92, After meals 78-117.   Patient reports good fetal movement. She denies any bleeding and no rupture of membranes symptoms or regular contractions.  BP weight and urine results reviewed and noted. Blood pressure 122/60, pulse 76, weight 250 lb 12.8 oz (113.8 kg), last menstrual period 05/16/2016.  Fundal Height:  37 cm Fetal Heart rate:  120 bpm with accels to 150 Physical Examination: Abdomen - soft, nontender, nondistended, no masses or organomegaly                                     Edema:  none  Urinalysis:NEGATIVE for all  Fetal Surveillance Testing today:  NST reactive with accels    Lab and sonogram results have been reviewed.  Assessment:  1.  Pregnancy at 7064w3d,  G2P0010   :  Estimated Date of Delivery: 02/20/17                         2.  Class A2 DM well controlled   Medication(s) Plans:  Glyburide 5 mg PM   Treatment Plan:  Twice weekly NST with repeat EFW 38weeks                               Follow up in 4 days for appointment for high risk OB care, NST   By signing my name below, I, Doreatha MartinEva Mathews, attest that this documentation has been prepared under the direction and in the presence of Tilda BurrowFerguson, Clevon Khader V, MD. Electronically Signed: Doreatha MartinEva Mathews, ED Scribe. 01/05/17. 10:21 AM.  I personally performed the services described in this documentation, which was SCRIBED in my presence. The recorded information has been reviewed and considered accurate. It has been edited as necessary during review. Tilda BurrowFERGUSON,Dillard Pascal V, MD

## 2017-01-09 ENCOUNTER — Encounter: Payer: Self-pay | Admitting: Obstetrics & Gynecology

## 2017-01-09 ENCOUNTER — Ambulatory Visit (INDEPENDENT_AMBULATORY_CARE_PROVIDER_SITE_OTHER): Payer: BLUE CROSS/BLUE SHIELD | Admitting: Obstetrics & Gynecology

## 2017-01-09 VITALS — BP 110/62 | HR 86 | Wt 253.0 lb

## 2017-01-09 DIAGNOSIS — Z331 Pregnant state, incidental: Secondary | ICD-10-CM

## 2017-01-09 DIAGNOSIS — O0990 Supervision of high risk pregnancy, unspecified, unspecified trimester: Secondary | ICD-10-CM

## 2017-01-09 DIAGNOSIS — O24419 Gestational diabetes mellitus in pregnancy, unspecified control: Secondary | ICD-10-CM | POA: Diagnosis not present

## 2017-01-09 DIAGNOSIS — Z1389 Encounter for screening for other disorder: Secondary | ICD-10-CM

## 2017-01-09 LAB — POCT URINALYSIS DIPSTICK
Blood, UA: NEGATIVE
Glucose, UA: NEGATIVE
KETONES UA: NEGATIVE
Leukocytes, UA: NEGATIVE
Nitrite, UA: NEGATIVE

## 2017-01-09 NOTE — Progress Notes (Signed)
Fetal Surveillance Testing today:  Reactive NST   High Risk Pregnancy Diagnosis(es):   Class A2 DM  G2P0010 3374w0d  Estimated Date of Delivery: 02/20/17  Blood pressure 110/62, pulse 86, weight 253 lb (114.8 kg), last menstrual period 05/16/2016.  Urinalysis: Negative   HPI: The patient is being seen today for ongoing management of Class A2 DM. Today she reports CBG are excellent   BP weight and urine results all reviewed and noted. Patient reports good fetal movement, denies any bleeding and no rupture of membranes symptoms or regular contractions.  Fundal Height:  38 Fetal Heart rate:  140 Edema:  3+  Patient is without complaints other than noted in her HPI. All questions were answered.  All lab and sonogram results have been reviewed. Comments:    Assessment:  1.  Pregnancy at 7774w0d ,  Estimated Date of Delivery: 02/20/17 :                          2.  Class A2 DM                        3.    Medication(s) Plans:  Glyburide 5 pm  Treatment Plan:  Twice weekly NST with repeat EFW 36-37 weeks  Return in about 3 days (around 01/12/2017) for NST, HROB. for appointment for high risk OB care  No orders of the defined types were placed in this encounter.  Orders Placed This Encounter  Procedures  . POCT urinalysis dipstick

## 2017-01-12 ENCOUNTER — Ambulatory Visit (INDEPENDENT_AMBULATORY_CARE_PROVIDER_SITE_OTHER): Payer: BLUE CROSS/BLUE SHIELD | Admitting: Obstetrics & Gynecology

## 2017-01-12 ENCOUNTER — Encounter: Payer: Self-pay | Admitting: Obstetrics & Gynecology

## 2017-01-12 VITALS — BP 128/72 | HR 84 | Wt 251.0 lb

## 2017-01-12 DIAGNOSIS — O0990 Supervision of high risk pregnancy, unspecified, unspecified trimester: Secondary | ICD-10-CM

## 2017-01-12 DIAGNOSIS — O24419 Gestational diabetes mellitus in pregnancy, unspecified control: Secondary | ICD-10-CM | POA: Diagnosis not present

## 2017-01-12 DIAGNOSIS — Z331 Pregnant state, incidental: Secondary | ICD-10-CM

## 2017-01-12 DIAGNOSIS — O0993 Supervision of high risk pregnancy, unspecified, third trimester: Secondary | ICD-10-CM

## 2017-01-12 DIAGNOSIS — Z1389 Encounter for screening for other disorder: Secondary | ICD-10-CM

## 2017-01-12 LAB — POCT URINALYSIS DIPSTICK
Glucose, UA: NEGATIVE
Ketones, UA: NEGATIVE
Leukocytes, UA: NEGATIVE
Nitrite, UA: NEGATIVE
Protein, UA: NEGATIVE
RBC UA: NEGATIVE

## 2017-01-12 NOTE — Progress Notes (Signed)
Fetal Surveillance Testing today:  Reactive NST   High Risk Pregnancy Diagnosis(es):   Class AII diabetes on glyburide 5 mg at 2200  G2P0010 7976w3d Estimated Date of Delivery: 02/20/17  Blood pressure 128/72, pulse 84, weight 251 lb (113.9 kg), last menstrual period 05/16/2016.  Urinalysis: Negative   HPI: The patient is being seen today for ongoing management of as above. Today she reports CBGs are all good   BP weight and urine results all reviewed and noted. Patient reports good fetal movement, denies any bleeding and no rupture of membranes symptoms or regular contractions.  Fundal Height:  38 Fetal Heart rate:  140 Edema:  2+  Patient is without complaints other than noted in her HPI. All questions were answered.  All lab and sonogram results have been reviewed. Comments:    Assessment:  1.  Pregnancy at 3876w3d,  Estimated Date of Delivery: 02/20/17 :                          2.  Class A2 diabetes                        3.  Lower extremity edema  Medication(s) Plans:  Glyburide 5 mg at 2200  Treatment Plan:  Twice weekly NSTs with ultrasound at 36 weeks for estimated fetal weight planned delivery at 39 weeks unless otherwise clinically indicated  No Follow-up on file. for appointment for high risk OB care  No orders of the defined types were placed in this encounter.  Orders Placed This Encounter  Procedures  . POCT urinalysis dipstick

## 2017-01-16 ENCOUNTER — Encounter: Payer: Self-pay | Admitting: Women's Health

## 2017-01-16 ENCOUNTER — Ambulatory Visit (INDEPENDENT_AMBULATORY_CARE_PROVIDER_SITE_OTHER): Payer: BLUE CROSS/BLUE SHIELD | Admitting: Women's Health

## 2017-01-16 VITALS — BP 112/50 | HR 86 | Wt 254.0 lb

## 2017-01-16 DIAGNOSIS — Z794 Long term (current) use of insulin: Secondary | ICD-10-CM

## 2017-01-16 DIAGNOSIS — O0993 Supervision of high risk pregnancy, unspecified, third trimester: Secondary | ICD-10-CM | POA: Diagnosis not present

## 2017-01-16 DIAGNOSIS — O24419 Gestational diabetes mellitus in pregnancy, unspecified control: Secondary | ICD-10-CM | POA: Diagnosis not present

## 2017-01-16 DIAGNOSIS — Z1389 Encounter for screening for other disorder: Secondary | ICD-10-CM

## 2017-01-16 DIAGNOSIS — L299 Pruritus, unspecified: Secondary | ICD-10-CM

## 2017-01-16 DIAGNOSIS — Z331 Pregnant state, incidental: Secondary | ICD-10-CM

## 2017-01-16 LAB — POCT URINALYSIS DIPSTICK
Glucose, UA: NEGATIVE
Ketones, UA: NEGATIVE
Leukocytes, UA: NEGATIVE
Nitrite, UA: NEGATIVE
RBC UA: NEGATIVE

## 2017-01-16 NOTE — Progress Notes (Signed)
High Risk Pregnancy Diagnosis(es): A2DM G2P0010 1132w0d Estimated Date of Delivery: 02/20/17 BP (!) 112/50   Pulse 86   Wt 254 lb (115.2 kg)   LMP 05/16/2016 (Approximate)   BMI 35.43 kg/m   Urinalysis: Positive for tr protein HPI:  Itching all over, worse on palms/soles, and at night x 2d. Has had food this am-will get fasting bile acids/cmp in am. All sugars normal, except last night's supper (125)- had [place] BP, weight, and urine reviewed.  Reports good fm. Denies regular uc's, lof, vb, uti s/s.  Fundal Height:  38 Fetal Heart rate:  135, reactive NST Edema:  1+  Reviewed ptl s/s, fkc All questions were answered Assessment: 3332w0d A2DM, possible cholestasis Medication(s) Plans:  Continue glyburide 5mg  PM Treatment Plan:  Fasting bile acids/cmp in am to check for cholestasis, continue 2x/wk NST w/ EFW u/s @ 38wks and delivery @ 39wks- discussed plan will change if dx w/ ICP Follow up in am for labs (no visit), then 3d for high-risk OB appt and NST

## 2017-01-16 NOTE — Patient Instructions (Addendum)
Nothing to eat/drink after midnight tonight, come to lab in morning, they open at 8am  Call the office [phone]) or go to [facility] if:  You begin to have strong, frequent contractions  Your water breaks.  Sometimes it is a big gush of fluid, sometimes it is just a trickle that keeps getting your panties wet or running down your legs  You have vaginal bleeding.  It is normal to have a small amount of spotting if your cervix was checked.   You don't feel your baby moving like normal.  If you don't, get you something to eat and drink and lay down and focus on feeling your baby move.  You should feel at least 10 movements in 2 hours.  If you don't, you should call the office or go to [facility].     Preterm Labor and Birth Information The normal length of a pregnancy is 39-41 weeks. Preterm labor is when labor starts before 37 completed weeks of pregnancy. What are the risk factors for preterm labor? Preterm labor is more likely to occur in women who:  Have certain infections during pregnancy such as a bladder infection, sexually transmitted infection, or infection inside the uterus (chorioamnionitis).  Have a shorter-than-normal cervix.  Have gone into preterm labor before.  Have had surgery on their cervix.  Are younger than age 44 or older than age 69.  Are African American.  Are pregnant with twins or multiple babies (multiple gestation).  Take street drugs or smoke while pregnant.  Do not gain enough weight while pregnant.  Became pregnant shortly after having been pregnant.  What are the symptoms of preterm labor? Symptoms of preterm labor include:  Cramps similar to those that can happen during a menstrual period. The cramps may happen with diarrhea.  Pain in the abdomen or lower back.  Regular uterine contractions that may feel like tightening of the abdomen.  A feeling of increased pressure in the pelvis.  Increased watery or bloody mucus discharge  from the vagina.  Water breaking (ruptured amniotic sac).  Why is it important to recognize signs of preterm labor? It is important to recognize signs of preterm labor because babies who are born prematurely may not be fully developed. This can put them at an increased risk for:  Long-term (chronic) heart and lung problems.  Difficulty immediately after birth with regulating body systems, including blood sugar, body temperature, heart rate, and breathing rate.  Bleeding in the brain.  Cerebral palsy.  Learning difficulties.  Death.  These risks are highest for babies who are born before 34 weeks of pregnancy. How is preterm labor treated? Treatment depends on the length of your pregnancy, your condition, and the health of your baby. It may involve:  Having a stitch (suture) placed in your cervix to prevent your cervix from opening too early (cerclage).  Taking or being given medicines, such as: ? Hormone medicines. These may be given early in pregnancy to help support the pregnancy. ? Medicine to stop contractions. ? Medicines to help mature the baby's lungs. These may be prescribed if the risk of delivery is high. ? Medicines to prevent your baby from developing cerebral palsy.  If the labor happens before 34 weeks of pregnancy, you may need to stay in the hospital. What should I do if I think I am in preterm labor? If you think that you are going into preterm labor, call your health care provider right away. How can I prevent preterm labor  in future pregnancies? To increase your chance of having a full-term pregnancy:  Do not use any tobacco products, such as cigarettes, chewing tobacco, and e-cigarettes. If you need help quitting, ask your health care provider.  Do not use street drugs or medicines that have not been prescribed to you during your pregnancy.  Talk with your health care provider before taking any herbal supplements, even if you have been taking them  regularly.  Make sure you gain a healthy amount of weight during your pregnancy.  Watch for infection. If you think that you might have an infection, get it checked right away.  Make sure to tell your health care provider if you have gone into preterm labor before.  This information is not intended to replace advice given to you by your health care provider. Make sure you discuss any questions you have with your health care provider. Document Released: 10/08/2003 Document Revised: 12/29/2015 Document Reviewed: 12/09/2015 Elsevier Interactive Patient Education  2018 [employer]

## 2017-01-17 ENCOUNTER — Other Ambulatory Visit: Payer: BLUE CROSS/BLUE SHIELD

## 2017-01-17 DIAGNOSIS — L739 Follicular disorder, unspecified: Secondary | ICD-10-CM

## 2017-01-17 DIAGNOSIS — O99891 Other specified diseases and conditions complicating pregnancy: Secondary | ICD-10-CM

## 2017-01-17 DIAGNOSIS — O9989 Other specified diseases and conditions complicating pregnancy, childbirth and the puerperium: Principal | ICD-10-CM

## 2017-01-18 LAB — COMPREHENSIVE METABOLIC PANEL WITH GFR
ALT: 162 IU/L — ABNORMAL HIGH (ref 0–32)
AST: 89 IU/L — ABNORMAL HIGH (ref 0–40)
Albumin/Globulin Ratio: 1.2 (ref 1.2–2.2)
Albumin: 3.2 g/dL — ABNORMAL LOW (ref 3.5–5.5)
Alkaline Phosphatase: 209 IU/L — ABNORMAL HIGH (ref 39–117)
BUN/Creatinine Ratio: 13 (ref 9–23)
BUN: 11 mg/dL (ref 6–20)
Bilirubin Total: 0.2 mg/dL (ref 0.0–1.2)
CO2: 22 mmol/L (ref 20–29)
Calcium: 9.2 mg/dL (ref 8.7–10.2)
Chloride: 104 mmol/L (ref 96–106)
Creatinine, Ser: 0.82 mg/dL (ref 0.57–1.00)
GFR calc Af Amer: 111 mL/min/1.73
GFR calc non Af Amer: 96 mL/min/1.73
Globulin, Total: 2.6 g/dL (ref 1.5–4.5)
Glucose: 75 mg/dL (ref 65–99)
Potassium: 4.3 mmol/L (ref 3.5–5.2)
Sodium: 139 mmol/L (ref 134–144)
Total Protein: 5.8 g/dL — ABNORMAL LOW (ref 6.0–8.5)

## 2017-01-18 LAB — BILE ACIDS, TOTAL: Bile Acids Total: 17.3 umol/L (ref 4.7–24.5)

## 2017-01-19 ENCOUNTER — Ambulatory Visit (INDEPENDENT_AMBULATORY_CARE_PROVIDER_SITE_OTHER): Payer: BLUE CROSS/BLUE SHIELD | Admitting: Women's Health

## 2017-01-19 ENCOUNTER — Encounter: Payer: Self-pay | Admitting: Women's Health

## 2017-01-19 VITALS — BP 128/60 | HR 76 | Wt 254.5 lb

## 2017-01-19 DIAGNOSIS — O0993 Supervision of high risk pregnancy, unspecified, third trimester: Secondary | ICD-10-CM

## 2017-01-19 DIAGNOSIS — O26613 Liver and biliary tract disorders in pregnancy, third trimester: Secondary | ICD-10-CM

## 2017-01-19 DIAGNOSIS — Z1389 Encounter for screening for other disorder: Secondary | ICD-10-CM

## 2017-01-19 DIAGNOSIS — Z794 Long term (current) use of insulin: Secondary | ICD-10-CM

## 2017-01-19 DIAGNOSIS — Z331 Pregnant state, incidental: Secondary | ICD-10-CM

## 2017-01-19 DIAGNOSIS — O24419 Gestational diabetes mellitus in pregnancy, unspecified control: Secondary | ICD-10-CM

## 2017-01-19 DIAGNOSIS — K831 Obstruction of bile duct: Secondary | ICD-10-CM | POA: Diagnosis not present

## 2017-01-19 LAB — POCT URINALYSIS DIPSTICK
Blood, UA: NEGATIVE
GLUCOSE UA: NEGATIVE
Ketones, UA: NEGATIVE
Leukocytes, UA: NEGATIVE
NITRITE UA: NEGATIVE
Protein, UA: NEGATIVE

## 2017-01-19 MED ORDER — URSODIOL 300 MG PO CAPS: 300.0000 mg | ORAL_CAPSULE | Freq: Three times a day (TID) | ORAL | 0 refills | Status: DC

## 2017-01-19 NOTE — Patient Instructions (Signed)
Call the office [phone]) or go to [facility] if:  You begin to have strong, frequent contractions  Your water breaks.  Sometimes it is a big gush of fluid, sometimes it is just a trickle that keeps getting your panties wet or running down your legs  You have vaginal bleeding.  It is normal to have a small amount of spotting if your cervix was checked.   You don't feel your baby moving like normal.  If you don't, get you something to eat and drink and lay down and focus on feeling your baby move.  You should feel at least 10 movements in 2 hours.  If you don't, you should call the office or go to [facility].     Cholestasis of Pregnancy Cholestasis refers to any condition that causes the flow of digestive fluid (bile) produced by the liver to slow down or stop. Cholestasis of pregnancy is most common toward the end of pregnancy (thirdtrimester), but it can occur any time during pregnancy. The condition often goes away soon after giving birth. Cholestasis may be uncomfortable, but it is usually harmless to you. However, it can be harmful to your baby. Cholestasis may increase the risk of:  Your baby being born too early (preterm delivery).  Your baby having a slow heart rate and lack of oxygen during delivery (fetal distress).  Losing your baby before delivery (stillbirth).  What are the causes? The exact cause of this condition is not known, but it may be related to:  Pregnancy hormones. The gallbladder normally holds bile until you need it to help digest fat in your diet. Pregnancy hormones may cause the flow of bile to slow down and back up into your liver. Bile may then get into your bloodstream and cause cholestasis symptoms.  Changes in your genes (genetic mutations). Specifically, genes that affect how the liver releases bile.  What increases the risk? You are more likely to develop this condition if:  You had cholestasis during a previous pregnancy.  You have a  family history of cholestasis.  You have liver problems.  You are having multiple babies, such as twins or triplets.  What are the signs or symptoms? The most common symptom of this condition is intense itching (pruritus), especially on the palms of your hands and soles of your feet. The itching can spread to the rest of your body and is often worse at night. You will not usually have a rash. Other symptoms may include:  Feeling tired.  Pain in your upper right abdomen.  Dark-colored urine.  Light-colored stools.  Poor appetite.  Yellowish discoloration of your skin and the whites of your eyes (jaundice).  How is this diagnosed? This condition is diagnosed based on:  Your medical history.  A physical exam.  Blood tests.  If you have an inherited risk for developing this condition, you may also have genetic testing. How is this treated? The goal of treatment is to make you comfortable and keep your baby safe. Your health care provider may:  Prescribe medicine to reduce bile acid in your bloodstream, relieve symptoms, and help keep your baby safe.  Give you vitamin K before delivery to prevent excessive bleeding.  Check your baby frequently (fetal monitoring).  Perform regular blood tests to check your bile levels and liver function until your baby is delivered.  Recommend starting (inducing) your labor and delivery by week 36 or 37 of pregnancy, or as soon as your baby's lungs have developed enough.  Follow these instructions at home:  Take over-the-counter and prescription medicines only as told by your health care provider.  Take cool baths to soothe itchy skin.  Wear comfortable, loose-fitting, cotton clothing to reduce itching.  Keep your fingernails short to prevent skin irritation from scratching.  Keep all follow-up visits and prenatal visits as told by your health care provider. This is important. Contact a health care provider if:  Your symptoms get  worse, even with treatment.  You develop pain in your right side.  You have unusual swelling in your abdomen, feet, ankles, or legs.  You have a fever.  You are more thirsty than usual. Get help right away if:  You go into early labor.  You have a headache that does not go away or causes changes in vision.  You have nausea or you vomit.  You have severe pain in your abdomen or shoulders.  You have shortness of breath. Summary  Cholestasis of pregnancy is most common toward the end of pregnancy (thirdtrimester), but it can occur any time during your pregnancy.  The condition often goes away soon after your baby is born.  The most common symptom of cholestasis of pregnancy is intense itching (pruritus), especially on the palms of your hands and soles of your feet.  This condition may be treated with medicine, frequent monitoring, or starting (inducing) labor and delivery by week 36 or 37 of pregnancy. This information is not intended to replace advice given to you by your health care provider. Make sure you discuss any questions you have with your health care provider. Document Released: 07/15/2000 Document Revised: 07/02/2016 Document Reviewed: 07/02/2016 Elsevier Interactive Patient Education  2017 [employer]

## 2017-01-19 NOTE — Progress Notes (Signed)
High Risk Pregnancy Diagnosis(es): A2DM, ICP dx today G2P0010 5053w3d Estimated Date of Delivery: 02/20/17 BP 128/60   Pulse 76   Wt 254 lb 8 oz (115.4 kg)   LMP 05/16/2016 (Approximate)   BMI 35.50 kg/m   Urinalysis: Negative HPI:  All sugars normal. Itching still bad. Labs 6/19: bile acids 17.3, AST/ALT 89/162= ICP BP, weight, and urine reviewed.  Reports good fm. Denies regular uc's, lof, vb, uti s/s.   Fundal Height:  38 Fetal Heart rate:  125, reactive NST Edema:  trace  Reviewed ptl s/s, fkc All questions were answered Assessment: 1853w3d A2DM, ICP dx today Medication(s) Plans:  rx urosdiol 300mg  TID, continue glyburide 5mg  pm Treatment Plan:   2x/wk testing   Deliver PRN or 37wks Follow up on Monday for high-risk OB appt and bpp/efw/afi u/s

## 2017-01-23 ENCOUNTER — Ambulatory Visit (INDEPENDENT_AMBULATORY_CARE_PROVIDER_SITE_OTHER): Payer: BLUE CROSS/BLUE SHIELD

## 2017-01-23 ENCOUNTER — Other Ambulatory Visit: Payer: BLUE CROSS/BLUE SHIELD | Admitting: Obstetrics and Gynecology

## 2017-01-23 ENCOUNTER — Telehealth (HOSPITAL_COMMUNITY): Payer: Self-pay | Admitting: *Deleted

## 2017-01-23 ENCOUNTER — Ambulatory Visit (INDEPENDENT_AMBULATORY_CARE_PROVIDER_SITE_OTHER): Payer: BLUE CROSS/BLUE SHIELD | Admitting: Women's Health

## 2017-01-23 ENCOUNTER — Encounter: Payer: Self-pay | Admitting: Women's Health

## 2017-01-23 VITALS — BP 128/74 | HR 76 | Wt 260.0 lb

## 2017-01-23 DIAGNOSIS — O35EXX0 Maternal care for other (suspected) fetal abnormality and damage, fetal genitourinary anomalies, not applicable or unspecified: Secondary | ICD-10-CM

## 2017-01-23 DIAGNOSIS — Z1389 Encounter for screening for other disorder: Secondary | ICD-10-CM

## 2017-01-23 DIAGNOSIS — O26613 Liver and biliary tract disorders in pregnancy, third trimester: Secondary | ICD-10-CM

## 2017-01-23 DIAGNOSIS — K831 Obstruction of bile duct: Secondary | ICD-10-CM

## 2017-01-23 DIAGNOSIS — Z331 Pregnant state, incidental: Secondary | ICD-10-CM

## 2017-01-23 DIAGNOSIS — F17200 Nicotine dependence, unspecified, uncomplicated: Secondary | ICD-10-CM

## 2017-01-23 DIAGNOSIS — O24419 Gestational diabetes mellitus in pregnancy, unspecified control: Secondary | ICD-10-CM | POA: Diagnosis not present

## 2017-01-23 DIAGNOSIS — O0990 Supervision of high risk pregnancy, unspecified, unspecified trimester: Secondary | ICD-10-CM

## 2017-01-23 DIAGNOSIS — O358XX0 Maternal care for other (suspected) fetal abnormality and damage, not applicable or unspecified: Secondary | ICD-10-CM

## 2017-01-23 DIAGNOSIS — O0993 Supervision of high risk pregnancy, unspecified, third trimester: Secondary | ICD-10-CM

## 2017-01-23 DIAGNOSIS — O26643 Intrahepatic cholestasis of pregnancy, third trimester: Secondary | ICD-10-CM

## 2017-01-23 DIAGNOSIS — Z3483 Encounter for supervision of other normal pregnancy, third trimester: Secondary | ICD-10-CM

## 2017-01-23 DIAGNOSIS — Z3A36 36 weeks gestation of pregnancy: Secondary | ICD-10-CM

## 2017-01-23 LAB — POCT URINALYSIS DIPSTICK
Blood, UA: NEGATIVE
KETONES UA: NEGATIVE
Leukocytes, UA: NEGATIVE
Nitrite, UA: NEGATIVE
Protein, UA: NEGATIVE

## 2017-01-23 LAB — OB RESULTS CONSOLE GBS: GBS: POSITIVE

## 2017-01-23 NOTE — Progress Notes (Signed)
US 36 wks,cephalic,afi 16 cm,post pl gr 2,normal ovaries bilat,BPP 8/8,fhr 121 bpm,EFW 3514 g 90th %

## 2017-01-23 NOTE — Progress Notes (Signed)
v

## 2017-01-23 NOTE — Telephone Encounter (Signed)
Preadmission screen  

## 2017-01-23 NOTE — Treatment Plan (Signed)
   Induction Assessment Scheduling Form: Fax to [facility]:  [phone]  Deborah Taylor                                                                                   DOB:  February 26, 1986                                                            MRN:  [id number]                                                                     Phone #:   [phone]                         Provider:  [facility]  GP:  [id number]                                                            Estimated Date of Delivery: 02/20/17  Dating Criteria: LMP c/w 8wk u/s    Medical Indications for induction:  ICP, A2DM Admission Date/Time:  7/2 Gestational age on admission:  37.0   Filed Weights   01/23/17 1119  Weight: 260 lb (117.9 kg)   HIV:  Non Reactive (05/03 0847) GBS:    Not examined   Method of induction(proposed):  cytotec   Scheduling Provider Signature:  Marge DuncansBooker, Deborah Taylor, CNM                                            Today's Date:  01/23/2017

## 2017-01-23 NOTE — Addendum Note (Signed)
Addended by: Tish FredericksonLANCASTER, Abrie Egloff A on: 01/23/2017 12:24 PM   Modules accepted: Orders

## 2017-01-23 NOTE — Progress Notes (Signed)
High Risk Pregnancy Diagnosis(es): A2DM, ICP, suspected LGA G2P0010 6829w0d Estimated Date of Delivery: 02/20/17 BP 128/74   Pulse 76   Wt 260 lb (117.9 kg)   LMP 05/16/2016 (Approximate)   BMI 36.26 kg/m   Urinalysis: Positive for tr glucose HPI:  fbs 70-101 (only 1 >90, forgot meds night before), 2hr pp 70-125 (only 1 >120- drank sweet tea) BP, weight, and urine reviewed.  Reports good fm. Denies regular uc's, lof, vb, uti s/s. Ursodiol 250mg  TID (insurance wouldn't cover 300mg  TID) not helping itching- to take 2 pills (500mg ) TID  Fundal Height:  38 Fetal Heart rate:  121 u/s Edema:  1+ GBS, gc/ct collected today SVE per request: 1.5/70/ballotable, vtx  Reviewed today's u/s: vtx, afi 16cm, efw 90%/3512g (AC 99%), BPP 8/8 All questions were answered Assessment: 4129w0d A2DM, ICP, suspected LGA Medication(s) Plans:  Glyburide 5mg  pm, increase ursodiol to 500mg  TID (2 pills) Treatment Plan:  2x/wk testing     Scheduled IOL for 7/2 @ 2345 @ 37.0wks (induction form in epic) Follow up in 3d for high-risk OB appt and NST as scheduled- cancel remaining ob visits after that

## 2017-01-23 NOTE — Patient Instructions (Signed)
Your induction is scheduled for 7/2 @ 11:45pm. [facility] and let them know you are there to be induced. They will send someone from Labor & Delivery to come get you.    Increase your ursodiol to 500mg  (2 pills) three times daily  Call the office [phone]) or [facility] if:  You begin to have strong, frequent contractions  Your water breaks.  Sometimes it is a big gush of fluid, sometimes it is just a trickle that keeps getting your panties wet or running down your legs  You have vaginal bleeding.  It is normal to have a small amount of spotting if your cervix was checked.   You don't feel your baby moving like normal.  If you don't, get you something to eat and drink and lay down and focus on feeling your baby move.  You should feel at least 10 movements in 2 hours.  If you don't, you should call the office or [facility].     [facility]xton Hicks Contractions Contractions of the uterus can occur throughout pregnancy, but they are not always a sign that you are in labor. You may have practice contractions called Braxton Hicks contractions. These false labor contractions are sometimes confused with true labor. What are Deborah PeltonBraxton Hicks contractions? Braxton Hicks contractions are tightening movements that occur in the muscles of the uterus before labor. Unlike true labor contractions, these contractions do not result in opening (dilation) and thinning of the cervix. Toward the end of pregnancy (32-34 weeks), Braxton Hicks contractions can happen more often and may become stronger. These contractions are sometimes difficult to tell apart from true labor because they can be very uncomfortable. You should not feel embarrassed if you go to the hospital with false labor. Sometimes, the only way to tell if you are in true labor is for your health care provider to look for changes in the cervix. The health care provider will do a  physical exam and may monitor your contractions. If you are not in true labor, the exam should show that your cervix is not dilating and your water has not broken. If there are no prenatal problems or other health problems associated with your pregnancy, it is completely safe for you to be sent home with false labor. You may continue to have Braxton Hicks contractions until you go into true labor. How can I tell the difference between true labor and false labor?  Differences ? False labor ? Contractions last 30-70 seconds.: Contractions are usually shorter and not as strong as true labor contractions. ? Contractions become very regular.: Contractions are usually irregular. ? Discomfort is usually felt in the top of the uterus, and it spreads to the lower abdomen and low back.: Contractions are often felt in the front of the lower abdomen and in the groin. ? Contractions do not go away with walking.: Contractions may go away when you walk around or change positions while lying down. ? Contractions usually become more intense and increase in frequency.: Contractions get weaker and are shorter-lasting as time goes on. ? The cervix dilates and gets thinner.: The cervix usually does not dilate or become thin. Follow these instructions at home:  Take over-the-counter and prescription medicines only as told by your health care provider.  Keep up with your usual exercises and follow other instructions from your health care provider.  Eat and drink lightly if you think you are going into labor.  If Braxton Hicks contractions are making you uncomfortable: ? Change your position from lying down or resting to walking, or change from walking to resting. ? Sit and rest in a tub of warm water. ? Drink enough fluid to keep your urine clear or pale yellow. Dehydration may cause these contractions. ? Do slow and deep breathing several times an hour.  Keep all follow-up prenatal visits as told by your health  care provider. This is important. Contact a health care provider if:  You have a fever.  You have continuous pain in your abdomen. Get help right away if:  Your contractions become stronger, more regular, and closer together.  You have fluid leaking or gushing from your vagina.  You pass blood-tinged mucus (bloody show).  You have bleeding from your vagina.  You have low back pain that you never had before.  You feel your baby's head pushing down and causing pelvic pressure.  Your baby is not moving inside you as much as it used to. Summary  Contractions that occur before labor are called Braxton Hicks contractions, false labor, or practice contractions.  Braxton Hicks contractions are usually shorter, weaker, farther apart, and less regular than true labor contractions. True labor contractions usually become progressively stronger and regular and they become more frequent.  Manage discomfort from [facility] contractions by changing position, resting in a warm bath, drinking plenty of water, or practicing deep breathing. This information is not intended to replace advice given to you by your health care provider. Make sure you discuss any questions you have with your health care provider. Document Released: 07/18/2005 Document Revised: 06/06/2016 Document Reviewed: 06/06/2016 Elsevier Interactive Patient Education  2017 [employer].

## 2017-01-24 NOTE — Addendum Note (Signed)
Addended by: Cheral MarkerBOOKER, Jaylanie Boschee R on: 01/24/2017 04:26 PM   Modules accepted: Kipp BroodSmartSet

## 2017-01-25 LAB — GC/CHLAMYDIA PROBE AMP
Chlamydia trachomatis, NAA: NEGATIVE
NEISSERIA GONORRHOEAE BY PCR: NEGATIVE

## 2017-01-26 ENCOUNTER — Ambulatory Visit (INDEPENDENT_AMBULATORY_CARE_PROVIDER_SITE_OTHER): Payer: BLUE CROSS/BLUE SHIELD | Admitting: Advanced Practice Midwife

## 2017-01-26 ENCOUNTER — Encounter: Payer: Self-pay | Admitting: Advanced Practice Midwife

## 2017-01-26 VITALS — BP 120/70 | HR 72 | Wt 260.0 lb

## 2017-01-26 DIAGNOSIS — O26613 Liver and biliary tract disorders in pregnancy, third trimester: Secondary | ICD-10-CM | POA: Diagnosis not present

## 2017-01-26 DIAGNOSIS — Z1389 Encounter for screening for other disorder: Secondary | ICD-10-CM

## 2017-01-26 DIAGNOSIS — O0993 Supervision of high risk pregnancy, unspecified, third trimester: Secondary | ICD-10-CM | POA: Diagnosis not present

## 2017-01-26 DIAGNOSIS — K831 Obstruction of bile duct: Secondary | ICD-10-CM

## 2017-01-26 DIAGNOSIS — O24419 Gestational diabetes mellitus in pregnancy, unspecified control: Secondary | ICD-10-CM

## 2017-01-26 DIAGNOSIS — Z331 Pregnant state, incidental: Secondary | ICD-10-CM

## 2017-01-26 LAB — POCT URINALYSIS DIPSTICK
Blood, UA: NEGATIVE
GLUCOSE UA: NEGATIVE
KETONES UA: NEGATIVE
Leukocytes, UA: NEGATIVE
Nitrite, UA: NEGATIVE
Protein, UA: NEGATIVE

## 2017-01-26 LAB — CULTURE, BETA STREP (GROUP B ONLY): Strep Gp B Culture: POSITIVE — AB

## 2017-01-26 NOTE — Progress Notes (Signed)
Fetal Surveillance Testing today:  NST   High Risk Pregnancy Diagnosis(es):   A2DM, ICP, suspected LGA  G2P0010 374w3d Estimated Date of Delivery: 02/20/17  Blood pressure 120/70, pulse 72, weight 260 lb (117.9 kg), last menstrual period 05/16/2016.  Urinalysis: Negative   HPI: The patient is being seen today for ongoing management of the above. . Today she reports still itching, mainly areound 8pm. All blood sugars normal.     BP weight and urine results all reviewed and noted. Patient reports good fetal movement, denies any bleeding and no rupture of membranes symptoms or regular contractions.   Edema:  1+  Patient is without complaints other than noted in her HPI. All questions were answered.  All lab and sonogram results have been reviewed. Comments:  NST reactive  Assessment:  1.  Pregnancy at 5774w3d,  Estimated Date of Delivery: 02/20/17 :          A2DM, ICP, suspected LGA Medication(s) Plans:  Glyburide 5mg  pm, ursodiol to 500mg  TID (2 pills).  May increase afternoon dose to 3 pills Treatment Plan:     Scheduled IOL for 7/2 @ 2345 @ 37.0wks (induction form in epic)                 Return in about 5 weeks (around 03/02/2017) for post partum. for appointment for high risk OB care  No orders of the defined types were placed in this encounter.  Orders Placed This Encounter  Procedures  . POCT urinalysis dipstick

## 2017-01-26 NOTE — Addendum Note (Signed)
Addended by: Cheral MarkerBOOKER, Sudie Bandel R on: 01/26/2017 09:57 AM   Modules accepted: Orders, SmartSet

## 2017-01-29 ENCOUNTER — Inpatient Hospital Stay (HOSPITAL_COMMUNITY)
Admission: AD | Admit: 2017-01-29 | Discharge: 2017-01-29 | Disposition: A | Discharge status: Home / Self Care | Payer: BLUE CROSS/BLUE SHIELD | Source: Ambulatory Visit | Attending: Family Medicine | Admitting: Family Medicine

## 2017-01-29 ENCOUNTER — Encounter (HOSPITAL_COMMUNITY): Payer: Self-pay

## 2017-01-29 DIAGNOSIS — O358XX0 Maternal care for other (suspected) fetal abnormality and damage, not applicable or unspecified: Secondary | ICD-10-CM

## 2017-01-29 DIAGNOSIS — F17200 Nicotine dependence, unspecified, uncomplicated: Secondary | ICD-10-CM

## 2017-01-29 DIAGNOSIS — O471 False labor at or after 37 completed weeks of gestation: Secondary | ICD-10-CM

## 2017-01-29 DIAGNOSIS — O35EXX0 Maternal care for other (suspected) fetal abnormality and damage, fetal genitourinary anomalies, not applicable or unspecified: Secondary | ICD-10-CM

## 2017-01-29 DIAGNOSIS — O0990 Supervision of high risk pregnancy, unspecified, unspecified trimester: Secondary | ICD-10-CM

## 2017-01-29 LAB — GLUCOSE, CAPILLARY: GLUCOSE-CAPILLARY: 84 mg/dL (ref 65–99)

## 2017-01-29 IMAGING — CT CT HEAD W/O CM
3 of 4 series · 15 of 47 positions shown, 18 images · non-contrast
Comparison: None.

CLINICAL DATA: Left-sided headache for 1 week with pain behind the
left eye.

EXAM:
CT HEAD WITHOUT CONTRAST
TECHNIQUE: Contiguous axial images were obtained from the base of the skull
through the vertex without intravenous contrast.

[Series 2: head w/o · axial · non-contrast · 0.44mm/px · z∈[+86,+226]mm · 9 of 41 slices shown, 12 images]
[im 3/41  brain]
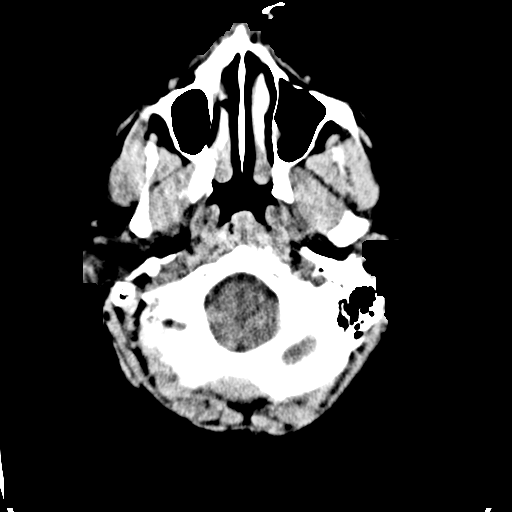
[im 3/41  bone]
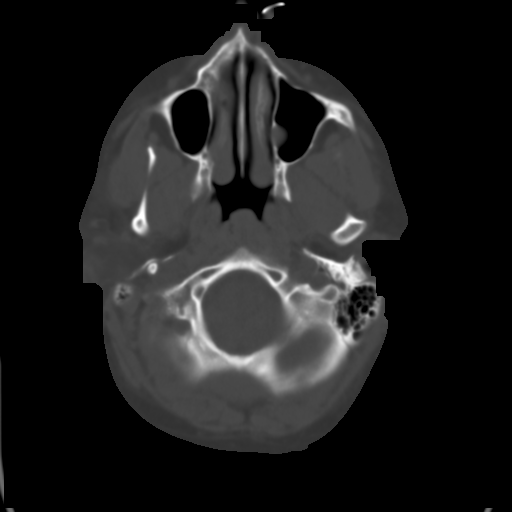
[im 9/41  brain]
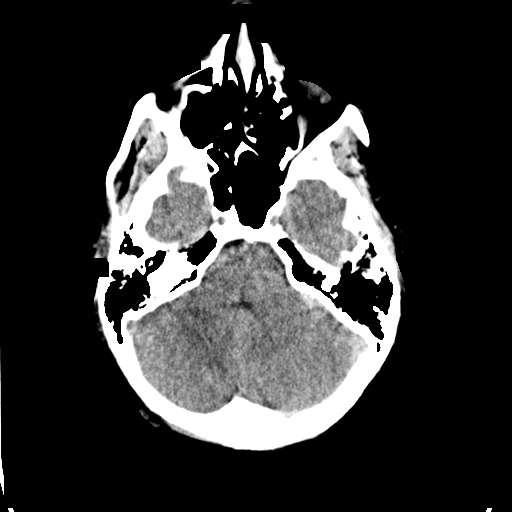
[im 12/41  brain]
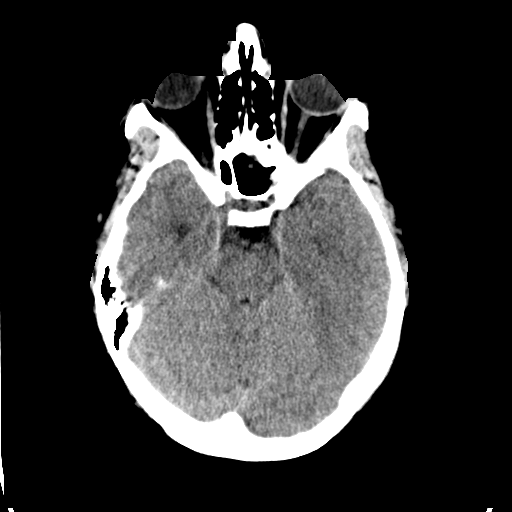
[im 18/41  brain]
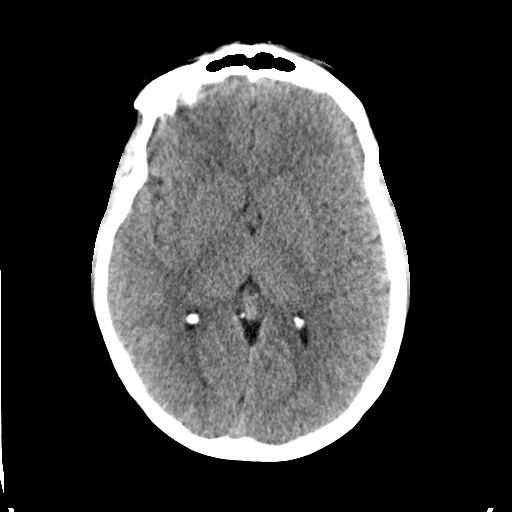
[im 21/41  brain]
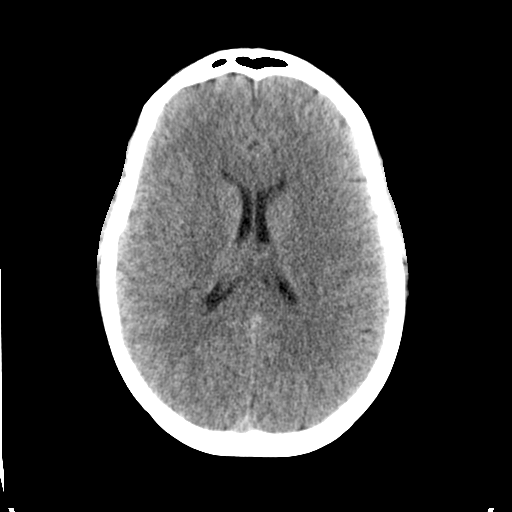
[im 21/41  bone]
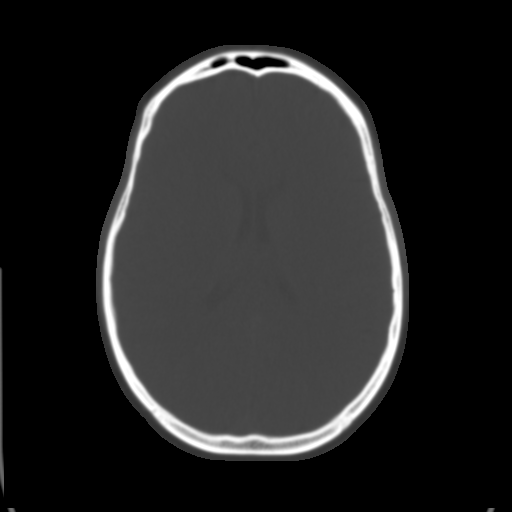
[im 23/41  brain]
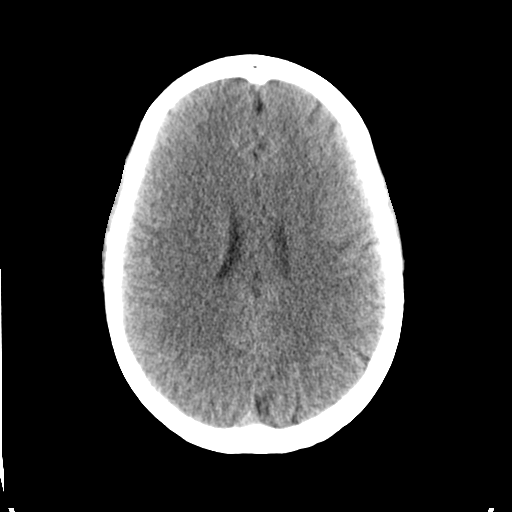
[im 29/41  brain]
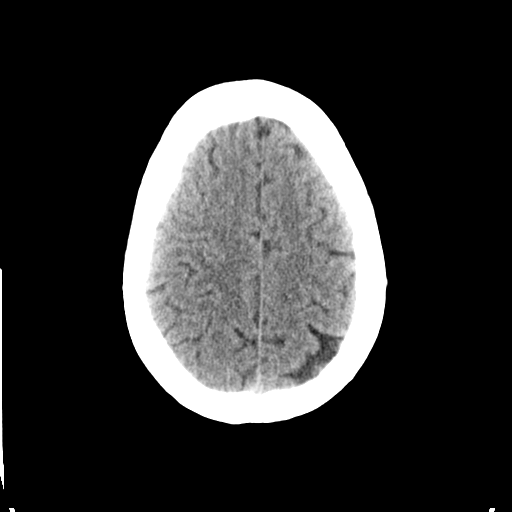
[im 32/41  brain]
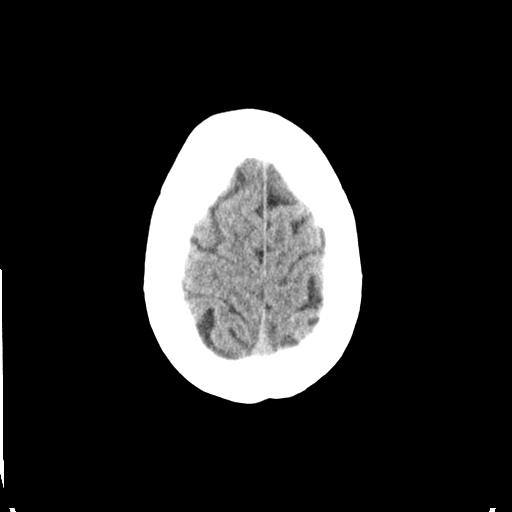
[im 38/41  brain]
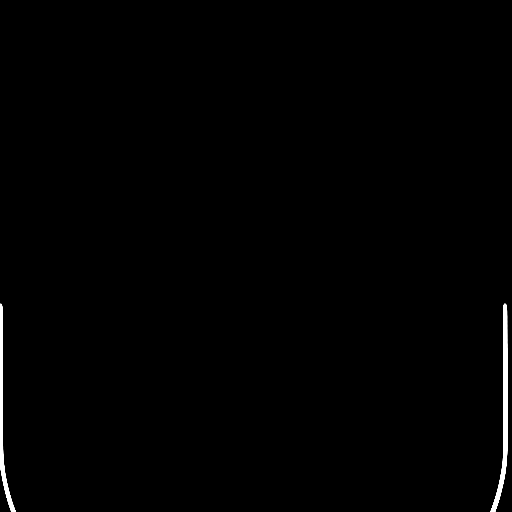
[im 38/41  bone]
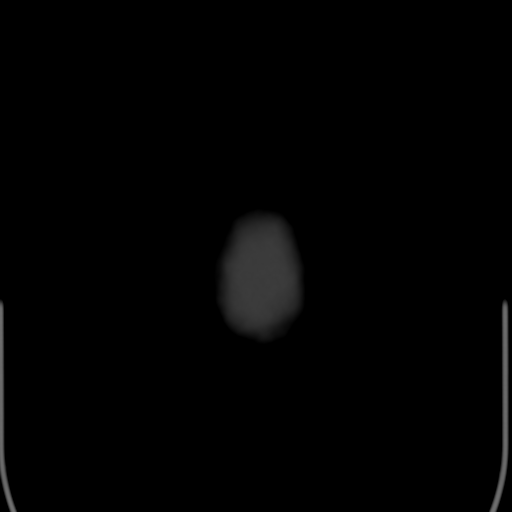

[Series 4: coronal · coronal · 0.32mm/px · 3 of 70 slices shown]
[im 24/70  brain]
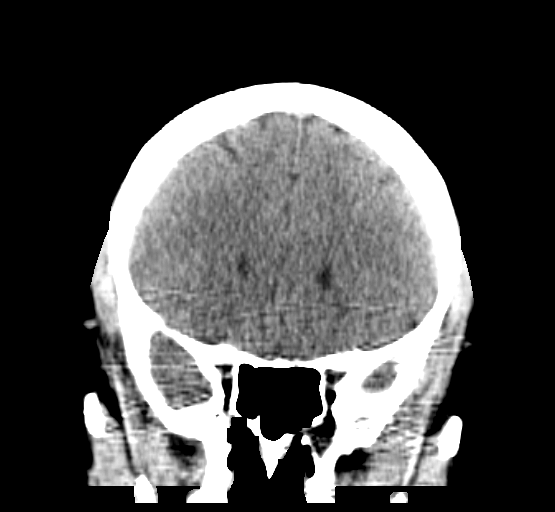
[im 31/70  brain]
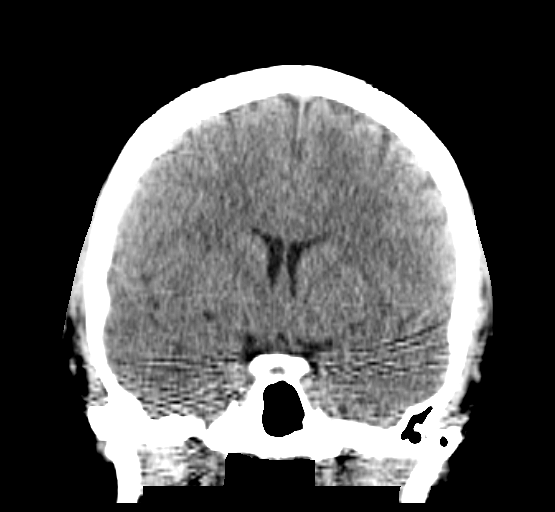
[im 39/70  brain]
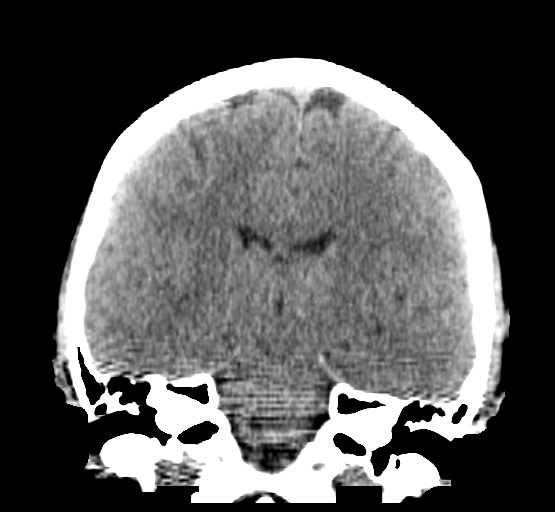

[Series 5: sagittal · sagittal · 0.32mm/px · 3 of 54 slices shown]
[im 18/54  brain]
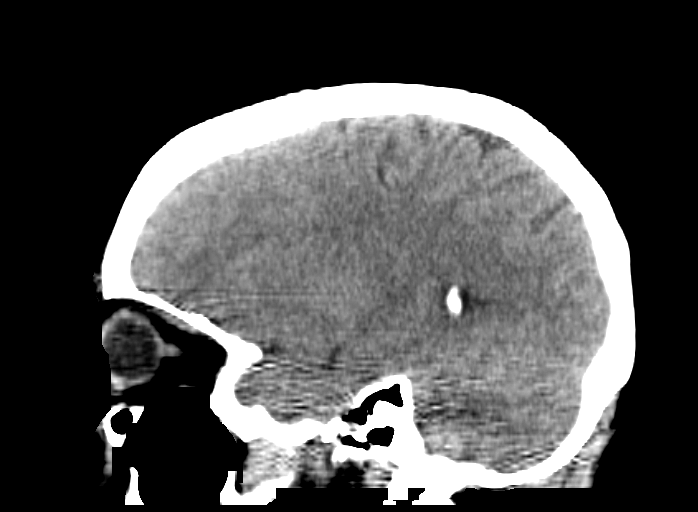
[im 27/54  brain]
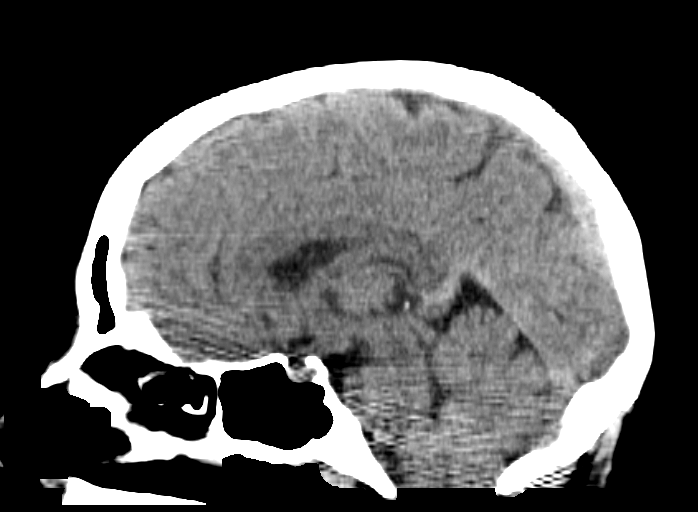
[im 36/54  brain]
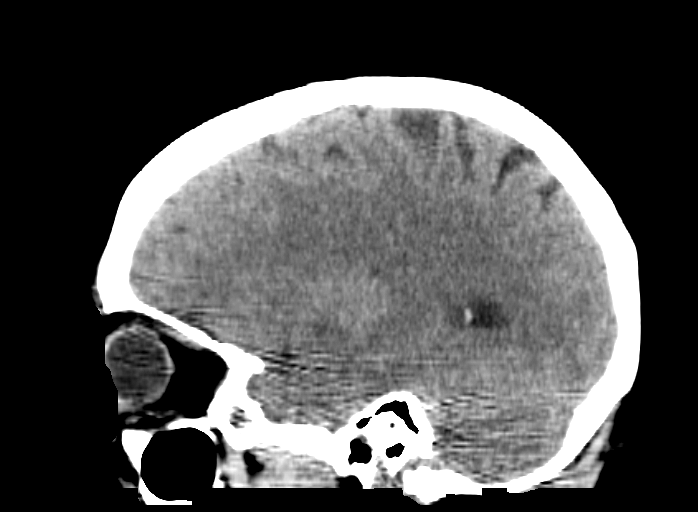

[15 of 47 positions shown; findings below may reference images not displayed]

FINDINGS: Brain: No evidence of acute infarction, hemorrhage, hydrocephalus,
extra-axial collection or mass lesion/mass effect. Presumed dilated
perivascular spaces below the putamina.

Vascular: No hyperdense vessel or unexpected calcification.

Skull: Normal. Negative for fracture or focal lesion.

Sinuses/Orbits: Negative. No acute sinusitis. No orbital mass or
inflammation.

Other: None.
IMPRESSION: Negative.  No explanation for headache.

## 2017-01-29 NOTE — Progress Notes (Addendum)
G2P0 @ 36.[redacted] wksga. Presents to triage for r/o labor. Denies LOF or bleeding. +FM. EFM applied.   SVE 1.5/60/-3  Pregnancy complicated with GDM - takes glyberixe at night and  Chloriiostatis. RH negative and received Rhogam.   GBS+  Pt states being induced tomorrow due to above issues.   [id number]: Provider notified. Report status of pt given. Orders received to walk pt for an hr and recheck cervix.   [id number]: pt sent walking  1107: SVE recehecked. 2/60/-3. Provider notified and aware. Also aware of slight high serial bp's   Discharge instructions given with pt understanding. Pt left unit via ambulatory with family.

## 2017-01-30 ENCOUNTER — Telehealth: Payer: Self-pay | Admitting: *Deleted

## 2017-01-30 ENCOUNTER — Other Ambulatory Visit: Payer: BLUE CROSS/BLUE SHIELD | Admitting: Obstetrics and Gynecology

## 2017-01-30 NOTE — Telephone Encounter (Signed)
Patient called stating she was having pain with contractions. [facility] and was told not to come back unless her water broke or for her induction. She states the contractions are not stronger or more frequent, just hurt. She is not leaking. Advised patient to take a warm bath, try tylenol or a heating pad on her back no longer than 20 minutes. Explained to patient that these contractions may feel strong to her but are not strong enough to dilate her cervix. Advised to go to [facility] if they become more frequent, stronger in intensity, leaking or bleeding. Verbalized understanding.

## 2017-01-31 ENCOUNTER — Inpatient Hospital Stay (HOSPITAL_COMMUNITY): Payer: BLUE CROSS/BLUE SHIELD | Admitting: Anesthesiology

## 2017-01-31 ENCOUNTER — Inpatient Hospital Stay (HOSPITAL_COMMUNITY)
Admission: RE | Admit: 2017-01-31 | Discharge: 2017-02-02 | DRG: 775 | Disposition: A | Discharge status: Home / Self Care | Payer: BLUE CROSS/BLUE SHIELD | Source: Ambulatory Visit | Attending: Family Medicine | Admitting: Family Medicine

## 2017-01-31 ENCOUNTER — Encounter (HOSPITAL_COMMUNITY): Payer: Self-pay

## 2017-01-31 DIAGNOSIS — O3663X0 Maternal care for excessive fetal growth, third trimester, not applicable or unspecified: Secondary | ICD-10-CM | POA: Diagnosis present

## 2017-01-31 DIAGNOSIS — Z6791 Unspecified blood type, Rh negative: Secondary | ICD-10-CM | POA: Diagnosis not present

## 2017-01-31 DIAGNOSIS — F17200 Nicotine dependence, unspecified, uncomplicated: Secondary | ICD-10-CM

## 2017-01-31 DIAGNOSIS — K831 Obstruction of bile duct: Secondary | ICD-10-CM | POA: Diagnosis present

## 2017-01-31 DIAGNOSIS — Z3A37 37 weeks gestation of pregnancy: Secondary | ICD-10-CM | POA: Diagnosis not present

## 2017-01-31 DIAGNOSIS — O2662 Liver and biliary tract disorders in childbirth: Principal | ICD-10-CM | POA: Diagnosis present

## 2017-01-31 DIAGNOSIS — O99824 Streptococcus B carrier state complicating childbirth: Secondary | ICD-10-CM | POA: Diagnosis present

## 2017-01-31 DIAGNOSIS — O24425 Gestational diabetes mellitus in childbirth, controlled by oral hypoglycemic drugs: Secondary | ICD-10-CM | POA: Diagnosis present

## 2017-01-31 DIAGNOSIS — F17210 Nicotine dependence, cigarettes, uncomplicated: Secondary | ICD-10-CM | POA: Diagnosis present

## 2017-01-31 DIAGNOSIS — O0990 Supervision of high risk pregnancy, unspecified, unspecified trimester: Secondary | ICD-10-CM

## 2017-01-31 DIAGNOSIS — O99334 Smoking (tobacco) complicating childbirth: Secondary | ICD-10-CM | POA: Diagnosis present

## 2017-01-31 DIAGNOSIS — O26893 Other specified pregnancy related conditions, third trimester: Secondary | ICD-10-CM | POA: Diagnosis present

## 2017-01-31 DIAGNOSIS — O35EXX0 Maternal care for other (suspected) fetal abnormality and damage, fetal genitourinary anomalies, not applicable or unspecified: Secondary | ICD-10-CM

## 2017-01-31 DIAGNOSIS — O358XX0 Maternal care for other (suspected) fetal abnormality and damage, not applicable or unspecified: Secondary | ICD-10-CM | POA: Diagnosis present

## 2017-01-31 DIAGNOSIS — O26643 Intrahepatic cholestasis of pregnancy, third trimester: Secondary | ICD-10-CM | POA: Diagnosis present

## 2017-01-31 DIAGNOSIS — O26613 Liver and biliary tract disorders in pregnancy, third trimester: Secondary | ICD-10-CM

## 2017-01-31 LAB — RPR: RPR Ser Ql: NONREACTIVE

## 2017-01-31 LAB — CBC
HCT: 39.2 % (ref 36.0–46.0)
Hemoglobin: 13.3 g/dL (ref 12.0–15.0)
MCH: 30.1 pg (ref 26.0–34.0)
MCHC: 33.9 g/dL (ref 30.0–36.0)
MCV: 88.7 fL (ref 78.0–100.0)
PLATELETS: 255 10*3/uL (ref 150–400)
RBC: 4.42 MIL/uL (ref 3.87–5.11)
RDW: 13.6 % (ref 11.5–15.5)
WBC: 17.8 10*3/uL — AB (ref 4.0–10.5)

## 2017-01-31 LAB — GLUCOSE, CAPILLARY
GLUCOSE-CAPILLARY: 71 mg/dL (ref 65–99)
GLUCOSE-CAPILLARY: 87 mg/dL (ref 65–99)
Glucose-Capillary: 72 mg/dL (ref 65–99)
Glucose-Capillary: 94 mg/dL (ref 65–99)
Glucose-Capillary: 90 mg/dL (ref 65–99)
Glucose-Capillary: 74 mg/dL (ref 65–99)

## 2017-01-31 MED ORDER — ACETAMINOPHEN 325 MG PO TABS: 650.0000 mg | ORAL_TABLET | ORAL | Status: DC | PRN

## 2017-01-31 MED ORDER — URSODIOL 250 MG PO TABS
250.0000 mg | ORAL_TABLET | Freq: Three times a day (TID) | ORAL | Status: DC
  Administered 2017-01-31: 250 mg via ORAL
  Filled 2017-01-31: qty 1

## 2017-01-31 MED ORDER — OXYTOCIN 40 UNITS IN LACTATED RINGERS INFUSION - SIMPLE MED
1.0000 m[IU]/min | INTRAVENOUS | Status: DC
  Administered 2017-01-31: 2 m[IU]/min via INTRAVENOUS

## 2017-01-31 MED ORDER — FENTANYL 2.5 MCG/ML BUPIVACAINE 1/10 % EPIDURAL INFUSION (WH - ANES)
14.0000 mL/h | INTRAMUSCULAR | Status: DC | PRN
  Administered 2017-01-31 (×3): 14 mL/h via EPIDURAL
  Filled 2017-01-31 (×3): qty 100

## 2017-01-31 MED ORDER — LIDOCAINE HCL (PF) 1 % IJ SOLN
30.0000 mL | INTRAMUSCULAR | Status: DC | PRN
  Administered 2017-02-01: 30 mL via SUBCUTANEOUS
  Filled 2017-01-31: qty 30

## 2017-01-31 MED ORDER — HYDROXYZINE HCL 50 MG PO TABS
50.0000 mg | ORAL_TABLET | Freq: Four times a day (QID) | ORAL | Status: DC | PRN
  Administered 2017-01-31: 50 mg via ORAL
  Filled 2017-01-31 (×2): qty 1

## 2017-01-31 MED ORDER — PHENYLEPHRINE 40 MCG/ML (10ML) SYRINGE FOR IV PUSH (FOR BLOOD PRESSURE SUPPORT)
80.0000 ug | PREFILLED_SYRINGE | INTRAVENOUS | Status: DC | PRN
  Filled 2017-01-31: qty 10
  Filled 2017-01-31: qty 5

## 2017-01-31 MED ORDER — TERBUTALINE SULFATE 1 MG/ML IJ SOLN
0.2500 mg | Freq: Once | INTRAMUSCULAR | Status: DC | PRN
  Filled 2017-01-31: qty 1

## 2017-01-31 MED ORDER — FENTANYL 2.5 MCG/ML BUPIVACAINE 1/10 % EPIDURAL INFUSION (WH - ANES): 14.0000 mL/h | INTRAMUSCULAR | Status: DC | PRN

## 2017-01-31 MED ORDER — OXYCODONE-ACETAMINOPHEN 5-325 MG PO TABS
2.0000 | ORAL_TABLET | ORAL | Status: DC | PRN
  Administered 2017-02-01: 2 via ORAL
  Filled 2017-01-31: qty 2

## 2017-01-31 MED ORDER — LACTATED RINGERS IV SOLN: 500.0000 mL | Freq: Once | INTRAVENOUS | Status: DC

## 2017-01-31 MED ORDER — URSODIOL 250 MG PO TABS
500.0000 mg | ORAL_TABLET | Freq: Three times a day (TID) | ORAL | Status: DC
  Administered 2017-01-31: 500 mg via ORAL

## 2017-01-31 MED ORDER — FENTANYL CITRATE (PF) 100 MCG/2ML IJ SOLN
50.0000 ug | INTRAMUSCULAR | Status: DC | PRN
  Administered 2017-01-31: 50 ug via INTRAVENOUS
  Filled 2017-01-31: qty 2

## 2017-01-31 MED ORDER — OXYCODONE-ACETAMINOPHEN 5-325 MG PO TABS: 1.0000 | ORAL_TABLET | ORAL | Status: DC | PRN

## 2017-01-31 MED ORDER — OXYTOCIN 40 UNITS IN LACTATED RINGERS INFUSION - SIMPLE MED
2.5000 [IU]/h | INTRAVENOUS | Status: DC
  Administered 2017-02-01: 2.5 [IU]/h via INTRAVENOUS
  Filled 2017-01-31: qty 1000

## 2017-01-31 MED ORDER — SOD CITRATE-CITRIC ACID 500-334 MG/5ML PO SOLN
30.0000 mL | ORAL | Status: DC | PRN
  Administered 2017-01-31 (×2): 30 mL via ORAL
  Filled 2017-01-31 (×2): qty 15

## 2017-01-31 MED ORDER — PENICILLIN G POTASSIUM 5000000 UNITS IJ SOLR
5.0000 10*6.[IU] | Freq: Once | INTRAVENOUS | Status: AC
  Administered 2017-01-31: 5 10*6.[IU] via INTRAVENOUS
  Filled 2017-01-31: qty 5

## 2017-01-31 MED ORDER — FLEET ENEMA 7-19 GM/118ML RE ENEM: 1.0000 | ENEMA | RECTAL | Status: DC | PRN

## 2017-01-31 MED ORDER — LIDOCAINE HCL (PF) 1 % IJ SOLN
INTRAMUSCULAR | Status: DC | PRN
  Administered 2017-01-31 (×2): 4 mL

## 2017-01-31 MED ORDER — FENTANYL CITRATE (PF) 100 MCG/2ML IJ SOLN
100.0000 ug | INTRAMUSCULAR | Status: DC | PRN
  Administered 2017-01-31 (×2): 100 ug via INTRAVENOUS
  Filled 2017-01-31 (×2): qty 2

## 2017-01-31 MED ORDER — PHENYLEPHRINE 40 MCG/ML (10ML) SYRINGE FOR IV PUSH (FOR BLOOD PRESSURE SUPPORT)
80.0000 ug | PREFILLED_SYRINGE | INTRAVENOUS | Status: DC | PRN
  Filled 2017-01-31: qty 5

## 2017-01-31 MED ORDER — LACTATED RINGERS IV SOLN
INTRAVENOUS | Status: DC
  Administered 2017-01-31 (×2): via INTRAVENOUS

## 2017-01-31 MED ORDER — ONDANSETRON HCL 4 MG/2ML IJ SOLN
4.0000 mg | Freq: Four times a day (QID) | INTRAMUSCULAR | Status: DC | PRN
  Administered 2017-01-31: 4 mg via INTRAVENOUS
  Filled 2017-01-31: qty 2

## 2017-01-31 MED ORDER — LACTATED RINGERS IV SOLN: 500.0000 mL | INTRAVENOUS | Status: DC | PRN

## 2017-01-31 MED ORDER — MISOPROSTOL 25 MCG QUARTER TABLET: 25.0000 ug | ORAL_TABLET | ORAL | Status: DC | PRN

## 2017-01-31 MED ORDER — PENICILLIN G POT IN DEXTROSE 60000 UNIT/ML IV SOLN
3.0000 10*6.[IU] | INTRAVENOUS | Status: DC
  Administered 2017-01-31 (×5): 3 10*6.[IU] via INTRAVENOUS
  Filled 2017-01-31 (×10): qty 50

## 2017-01-31 MED ORDER — OXYTOCIN BOLUS FROM INFUSION
500.0000 mL | Freq: Once | INTRAVENOUS | Status: AC
  Administered 2017-01-31: 500 mL via INTRAVENOUS

## 2017-01-31 MED ORDER — EPHEDRINE 5 MG/ML INJ
10.0000 mg | INTRAVENOUS | Status: DC | PRN
  Filled 2017-01-31: qty 2

## 2017-01-31 MED ORDER — DIPHENHYDRAMINE HCL 50 MG/ML IJ SOLN: 12.5000 mg | INTRAMUSCULAR | Status: DC | PRN

## 2017-01-31 NOTE — Anesthesia Pain Management Evaluation Note (Signed)
  [facility] Pain Management Visit Note  Patient: Deborah SkeetersHannah M Taylor, 31 y.o., female  "Hello I am a member of the anesthesia team [facility]. We have an anesthesia team available at all times to provide care throughout the hospital, including epidural management and anesthesia for C-section. I don't know your plan for the delivery whether it a natural birth, water birth, IV sedation, nitrous supplementation, doula or epidural, but we want to meet your pain goals."   1.Was your pain managed to your expectations on prior hospitalizations?   No prior hospitalizations  2.What is your expectation for pain management during this hospitalization?     Epidural  3.How can we help you reach that goal? Epidural in place.  Record the patient's initial score and the patient's pain goal.   Pain: 0  Pain Goal: 9 prior to epidural placement. [facility] wants you to be able to say your pain was always managed very well.  [name] 01/31/2017

## 2017-01-31 NOTE — Progress Notes (Signed)
Patient ID: Deborah Taylor, female   DOB: February 22, 1986, 31 y.o.   MRN: [id number]  S: Patient seen & examined for progress of labor induction. Patient comfortable with epidural. Patient has not made any further progress in labor.   O:  Vitals:   01/31/17 1030 01/31/17 1100 01/31/17 1130 01/31/17 1203  BP: 137/72 (!) 156/80 135/73 133/69  Pulse: 99 (!) 106 92 79  Resp:  18 18 18   Temp:      TempSrc:      SpO2:      Weight:      Height:        Dilation: 6 Effacement (%): 100 Station: -1, -2 Presentation: Vertex Exam by:: stone rnc  AROM performed, large amounts of clear fluid returned  FHT: 130bpm, mod var, +accels, no decels TOCO: q2-445min   A/P: AROM performed, clear Pitocin to be started Continue expectant management Anticipate SVD

## 2017-01-31 NOTE — Anesthesia Procedure Notes (Signed)
Epidural Patient location during procedure: OB  Staffing Anesthesiologist: Aubria Vanecek Performed: anesthesiologist   Preanesthetic Checklist Completed: patient identified, pre-op evaluation, timeout performed, IV checked, risks and benefits discussed and monitors and equipment checked  Epidural Patient position: sitting Prep: site prepped and draped and DuraPrep Patient monitoring: heart rate, continuous pulse ox and blood pressure Approach: midline Location: L3-L4 Injection technique: LOR air and LOR saline  Needle:  Needle type: Tuohy  Needle gauge: 17 G Needle length: 9 cm Needle insertion depth: 7 cm Catheter type: closed end flexible Catheter size: 19 Gauge Catheter at skin depth: 12 cm Test dose: negative  Assessment Sensory level: T8 Events: blood not aspirated, injection not painful, no injection resistance, negative IV test and no paresthesia  Additional Notes Reason for block:procedure for pain     

## 2017-01-31 NOTE — Progress Notes (Signed)
Patient ID: Deborah AlexandriaHannah M Taylor, female   DOB: 03-Sep-1985, 31 y.o.   MRN: [id number] Labor Progress Note  S: Patient wished to be rechecked, minimal change since this am.  O:  BP 135/73    Pulse 92    Temp 98.2 F (36.8 C) (Oral)    Resp 18    Ht 5\' 10"  (1.778 m)    Wt 117.9 kg (260 lb)    LMP 05/16/2016 (Approximate)    SpO2 99%    BMI 37.31 kg/m  FHT: 130 bpm, mod var, +accels, no decels TOCO: irregular, patient looks comfortable during contractions CVE: Dilation: 6 Effacement (%): 100 Station: -1, -2 Presentation: Vertex Exam by:: stone r   A&P: 31 y.o. G2P0010 4265w1d   Consider adding pitocin or AROM  Continue expectant management Anticipate SVD  [place] J Shirley 11:41 AM

## 2017-01-31 NOTE — H&P (Signed)
Deborah Taylor is a 31 y.o. female G1 @ 37.1wks by LMP and 8wk scan presenting for IOL due to cholestasis. She also reports reg ctx x 2d, denies leaking or bldg. Having some itching but is better than previous days. Her preg has been followed by the [facility] service and has been remarkable for 1) GDMA2 (glyburide 12m PM; EFW 90%) 2) bil fetal pylectasis <760m3) GBS pos 4) Rh neg 5) rubella nonimmune 6) smoker  OB History    Gravida Para Term Preterm AB Living   2       1 0   SAB TAB Ectopic Multiple Live Births     1           Past Medical History:  Diagnosis Date  . Medical history non-contributory    Past Surgical History:  Procedure Laterality Date  . NO PAST SURGERIES     Family History: family history includes Cancer in her brother, maternal aunt, maternal grandfather, maternal grandmother, and paternal grandfather; Diabetes in her maternal grandfather, maternal grandmother, and mother; Heart disease in her maternal grandfather and maternal grandmother; Kidney Stones in her father; Stroke in her paternal grandmother; Tremor in her paternal grandmother. Social History:  reports that she has been smoking Cigarettes.  She has a 2.50 pack-year smoking history. She has never used smokeless tobacco. She reports that she does not drink alcohol or use drugs.     Maternal Diabetes: Yes:  Diabetes Type:  Insulin/Medication controlled Genetic Screening: Normal Maternal Ultrasounds/Referrals: Abnormal:  Findings:   Fetal renal pyelectasis- bilateral <80m46metal Ultrasounds or other Referrals:  None Maternal Substance Abuse:  Yes:  Type: Smoker Significant Maternal Medications:  Meds include: Other: Ursodiol Significant Maternal Lab Results:  Lab values include: Group B Strep positive Other Comments:  None  ROS History   Blood pressure (!) 145/85, pulse (!) 117, temperature 98.6 F (37 C), temperature source Oral, resp. rate 20, height _0  (1.778 m), weight 117.9 kg (260 lb), last  menstrual period 05/16/2016. Exam Physical Exam  Constitutional: She is oriented to person, place, and time. She appears well-developed.  HENT:  Head: Normocephalic.  Neck: Normal range of motion.  Cardiovascular:  tachycardic  Respiratory: Effort normal.  GI:  EFM 130s, +accels, no decels Ctx q 2 mins  Genitourinary: Vagina normal.  Genitourinary Comments: Cx 2/90/-2, intact  Musculoskeletal: Normal range of motion.  Neurological: She is alert and oriented to person, place, and time.  Skin: Skin is warm and dry.  Psychiatric: She has a normal mood and affect. Her behavior is normal. Thought content normal.    Prenatal labs: ABO, Rh: O/Negative/-- (12/12 1247) Antibody: Negative (05/03 0847) Rubella: <0.90 (12/12 1247) RPR: Non Reactive (05/03 0847)  HBsAg: Negative (12/12 1247)  HIV: Non Reactive (05/03 0847)  GBS:   positive 6/25  Assessment/Plan: IUP_1 .1wks Cholestasis GDMA2 Rh neg Rubella nonimmune Latent labor  [facility] GBS ppx Cervical foley inserted without difficulty; plan Pit when it comes out Continue Ursodiol CBGs q 4 hrs Rhogam eval PP/ MMR PP Anticipate SVD   SHAW, KIMBERLY  CNM 01/31/2017, 1:27 AM

## 2017-01-31 NOTE — Progress Notes (Addendum)
Patient ID: [name], female   DOB: May 10, 1986, 31 y.o.   MRN: [id number] Labor Progress Note  S: Pt seen for progress of labor. Pt comfortable with epidural. AROM performed at 1230, pitocin started.   O:  BP 138/74   Pulse 94   Temp 99 F (37.2 C) (Oral)   Resp 18   Ht 5\' 10"  (1.778 m)   Wt 117.9 kg (260 lb)   LMP 05/16/2016 (Approximate)   SpO2 99%   BMI 37.31 kg/m  FHT: 135 bpm, mod var, +accels, no decels TOCO: 3-4 min , patient looks comfortable during contractions CVE: Dilation: 7 Effacement (%): 100 Station: -2 Presentation: Vertex Exam by:: dr Omer Jackmumaw   A&P: 31 y.o. G2P0010 2590w1d   Currently on 724milli-units/ min pitocin Continue expectant management Anticipate SVD  [place] J Shirley 1:38 PM

## 2017-01-31 NOTE — Anesthesia Preprocedure Evaluation (Signed)
Anesthesia Evaluation  Patient identified by MRN, date of birth, ID band Patient awake    Reviewed: Allergy & Precautions, NPO status , Patient's Chart, lab work & pertinent test results  Airway Mallampati: II  TM Distance: >3 FB Neck ROM: Full    Dental no notable dental hx.    Pulmonary neg pulmonary ROS, Current Smoker,    Pulmonary exam normal breath sounds clear to auscultation       Cardiovascular negative cardio ROS Normal cardiovascular exam Rhythm:Regular Rate:Normal     Neuro/Psych negative neurological ROS  negative psych ROS   GI/Hepatic negative GI ROS, Neg liver ROS,   Endo/Other  diabetes, Gestational  Renal/GU negative Renal ROS     Musculoskeletal negative musculoskeletal ROS (+)   Abdominal (+) + obese,   Peds  Hematology negative hematology ROS (+)   Anesthesia Other Findings   Reproductive/Obstetrics (+) Pregnancy                             Anesthesia Physical Anesthesia Plan  ASA: II  Anesthesia Plan: Epidural   Post-op Pain Management:    Induction:   PONV Risk Score and Plan:   Airway Management Planned:   Additional Equipment:   Intra-op Plan:   Post-operative Plan:   Informed Consent: I have reviewed the patients History and Physical, chart, labs and discussed the procedure including the risks, benefits and alternatives for the proposed anesthesia with the patient or authorized representative who has indicated his/her understanding and acceptance.     Plan Discussed with:   Anesthesia Plan Comments:         Anesthesia Quick Evaluation

## 2017-01-31 NOTE — Progress Notes (Signed)
Patient ID: Deborah Taylor, female   DOB: [birth year], 31 y.o.   MRN: [id number]   Patient home dose of ursodiol was 500mg , increased inpt dose from 250mg  to 500mg .   [place] J Shirley, DO

## 2017-01-31 NOTE — Progress Notes (Signed)
Labor Progress Note Rockwell AlexandriaHannah M Barbian is a 31 y.o. G2P0010 at 8854w1d presented for IOL for cholestasis.  S:  Comfortable with epidural, feeling some rectal pressure.   O:  BP (!) 159/105   Pulse (!) 103   Temp 98.7 F (37.1 C) (Oral)   Resp 16   Ht 5\' 10"  (1.778 m)   Wt 117.9 kg (260 lb)   LMP 05/16/2016 (Approximate)   SpO2 99%   BMI 37.31 kg/m  EFM: baseline 125 bpm/ mod variability/ + accels/ no decels  Toco: 2-4 SVE: Dilation: 10 Dilation Complete Date: 01/31/17 Dilation Complete Time: 2120 Effacement (%): 100 Cervical Position: Middle Station: +1 Presentation: Vertex Exam by:: Isra Lindy Pitocin: 14 mu/min  A/P: 31 y.o. G2P0010 4054w1d  1. Labor: second stage 2. FWB: Cat I 3. Pain: epidural 4. A2GDM  CBGs stable. Will start pushing. Prepare for LGA delivery. Anticipate SVD.  Donette LarryMelanie Tymere Depuy, CNM 9:52 PM

## 2017-02-01 ENCOUNTER — Encounter (HOSPITAL_COMMUNITY): Payer: Self-pay

## 2017-02-01 MED ORDER — IBUPROFEN 600 MG PO TABS
600.0000 mg | ORAL_TABLET | Freq: Four times a day (QID) | ORAL | Status: DC
  Administered 2017-02-01 – 2017-02-02 (×5): 600 mg via ORAL
  Filled 2017-02-01 (×7): qty 1

## 2017-02-01 MED ORDER — BENZOCAINE-MENTHOL 20-0.5 % EX AERO
1.0000 "application " | INHALATION_SPRAY | CUTANEOUS | Status: DC | PRN
  Filled 2017-02-01: qty 56

## 2017-02-01 MED ORDER — SENNOSIDES-DOCUSATE SODIUM 8.6-50 MG PO TABS
2.0000 | ORAL_TABLET | ORAL | Status: DC
  Administered 2017-02-02: 2 via ORAL
  Filled 2017-02-01 (×2): qty 2

## 2017-02-01 MED ORDER — WITCH HAZEL-GLYCERIN EX PADS: 1.0000 "application " | MEDICATED_PAD | CUTANEOUS | Status: DC | PRN

## 2017-02-01 MED ORDER — PRENATAL MULTIVITAMIN CH
1.0000 | ORAL_TABLET | Freq: Every day | ORAL | Status: DC
  Administered 2017-02-02: 1 via ORAL
  Filled 2017-02-01: qty 1

## 2017-02-01 MED ORDER — ACETAMINOPHEN 325 MG PO TABS: 650.0000 mg | ORAL_TABLET | ORAL | Status: DC | PRN

## 2017-02-01 MED ORDER — DIBUCAINE 1 % RE OINT: 1.0000 "application " | TOPICAL_OINTMENT | RECTAL | Status: DC | PRN

## 2017-02-01 MED ORDER — SIMETHICONE 80 MG PO CHEW: 80.0000 mg | CHEWABLE_TABLET | ORAL | Status: DC | PRN

## 2017-02-01 MED ORDER — MEASLES, MUMPS & RUBELLA VAC ~~LOC~~ INJ
0.5000 mL | INJECTION | Freq: Once | SUBCUTANEOUS | Status: AC
  Administered 2017-02-02: 0.5 mL via SUBCUTANEOUS
  Filled 2017-02-01 (×2): qty 0.5

## 2017-02-01 MED ORDER — DIPHENHYDRAMINE HCL 25 MG PO CAPS: 25.0000 mg | ORAL_CAPSULE | Freq: Four times a day (QID) | ORAL | Status: DC | PRN

## 2017-02-01 MED ORDER — ONDANSETRON HCL 4 MG/2ML IJ SOLN: 4.0000 mg | INTRAMUSCULAR | Status: DC | PRN

## 2017-02-01 MED ORDER — ONDANSETRON HCL 4 MG PO TABS: 4.0000 mg | ORAL_TABLET | ORAL | Status: DC | PRN

## 2017-02-01 MED ORDER — COCONUT OIL OIL: 1.0000 "application " | TOPICAL_OIL | Status: DC | PRN

## 2017-02-01 MED ORDER — TETANUS-DIPHTH-ACELL PERTUSSIS 5-2.5-18.5 LF-MCG/0.5 IM SUSP: 0.5000 mL | Freq: Once | INTRAMUSCULAR | Status: DC

## 2017-02-01 NOTE — Progress Notes (Signed)
Post Partum Day #1 Subjective: no complaints, up ad lib, voiding and tolerating PO  Objective: Blood pressure 125/71, pulse 91, temperature 97.9 F (36.6 C), resp. rate 18, height 5\' 10"  (1.778 m), weight 117.9 kg (260 lb), last menstrual period 05/16/2016, SpO2 98 %, unknown if currently breastfeeding.  Physical Exam:  General: alert Lochia: appropriate Uterine Fundus: firm DVT Evaluation: No evidence of DVT seen on physical exam.   Recent Labs  01/31/17 0122  HGB 13.3  HCT 39.2    Assessment/Plan: Plan for discharge tomorrow   LOS: 1 day   Cheral Cappucci C Ethan Kasperski 02/01/2017, 7:50 AM

## 2017-02-01 NOTE — Anesthesia Postprocedure Evaluation (Signed)
Anesthesia Post Note  Patient: Deborah Taylor  Procedure(s) Performed: * No procedures listed *     Patient location during evaluation: Mother Baby Anesthesia Type: Epidural Level of consciousness: awake, awake and alert, oriented and patient cooperative Pain management: pain level controlled Vital Signs Assessment: post-procedure vital signs reviewed and stable Respiratory status: spontaneous breathing, nonlabored ventilation and respiratory function stable Cardiovascular status: stable Postop Assessment: no headache, no backache, patient able to bend at knees and no signs of nausea or vomiting Anesthetic complications: no    Last Vitals:  Vitals:   02/01/17 0300 02/01/17 0658  BP: 133/65 125/71  Pulse: (!) 113 91  Resp: 20 18  Temp: 36.9 C 36.6 C    Last Pain:  Vitals:   02/01/17 0300  TempSrc: Oral  PainSc:    Pain Goal: Patients Stated Pain Goal: 6 (02/01/17 0130)               Clayten Allcock L

## 2017-02-01 NOTE — Progress Notes (Signed)
Benefits of breast feeding explained to pt.  Pt planning on bottle feeding.  Breast care for non breastfeeding mother explained to Pt.  Bottle feeding sheet given to pt w/explanation.  Fob at bedside as well as grandparents.  Pt instructed on frequency of feedings and hunger cues.

## 2017-02-02 ENCOUNTER — Other Ambulatory Visit: Payer: BLUE CROSS/BLUE SHIELD | Admitting: Women's Health

## 2017-02-02 MED ORDER — SENNOSIDES-DOCUSATE SODIUM 8.6-50 MG PO TABS: 1.0000 | ORAL_TABLET | Freq: Every evening | ORAL | 0 refills | Status: DC | PRN

## 2017-02-02 MED ORDER — IBUPROFEN 600 MG PO TABS: 600.0000 mg | ORAL_TABLET | Freq: Four times a day (QID) | ORAL | 0 refills | Status: DC

## 2017-02-02 NOTE — Discharge Instructions (Signed)

## 2017-02-02 NOTE — Discharge Summary (Signed)
OB Discharge Summary  Patient Name: Deborah Taylor DOB: 1986-01-26 MRN: [id number]  Date of admission: 01/31/2017 Delivering MD: Donette Larry   Date of discharge: 02/02/2017  Admitting diagnosis: INDUCTION Intrauterine pregnancy: [redacted]w[redacted]d     Secondary diagnosis:Active Problems:   Intrahepatic cholestasis of pregnancy in third trimester  Additional problems: Patient Active Problem List   Diagnosis Date Noted  . Intrahepatic cholestasis of pregnancy in third trimester 01/31/2017  . Cholestasis of pregnancy in third trimester 01/19/2017  . Gestational diabetes mellitus, class A2 12/02/2016  . Pyelectasis of fetus on prenatal ultrasound 10/06/2016  . Asymptomatic bacteriuria during pregnancy in first trimester 07/15/2016  . Supervision of high risk pregnancy, antepartum 07/13/2016  . Rh negative state in antepartum period 07/13/2016  . Rubella non-immune status, antepartum 07/13/2016  . Smoker 07/12/2016        Discharge diagnosis: Term Pregnancy Delivered                                                                     Post partum procedures:none (infant is O neg, so Rhogam not needed)  Augmentation: AROM, Pitocin, Cytotec and Foley Balloon  Complications: None  Hospital course:  Induction of Labor With Vaginal Delivery   31 y.o. yo G2P1011 at [redacted]w[redacted]d was admitted to the hospital 01/31/2017 for induction of labor.  Indication for induction: Cholestasis of pregnancy.  Patient had an uncomplicated induction/labor course as follows: Membrane Rupture Time/Date: 12:25 PM ,01/31/2017   Intrapartum Procedures: Episiotomy: None [1]                                         Lacerations:  2nd degree [3]  Patient had delivery of a Viable infant.  Information for the patient's newborn:  Ralene, Gasparyan [[id number]]  Delivery Method: Vaginal, Spontaneous Delivery (Filed from Delivery Summary)   01/31/2017  Details of delivery can be found in separate delivery note.  Patient had a  routine postpartum course. Patient is discharged home 02/02/17.  Physical exam  Vitals:   02/01/17 1008 02/01/17 1500 02/01/17 1852 02/02/17 0500  BP: 135/81 135/77 (!) 144/85 128/67  Pulse: (!) 103 86 96 78  Resp:   18 18  Temp: 98 F (36.7 C) 98.3 F (36.8 C) 97.8 F (36.6 C) 97.8 F (36.6 C)  TempSrc: Oral Oral Oral   SpO2:      Weight:      Height:       General: alert, cooperative and no distress Lochia: appropriate Uterine Fundus: firm Incision: N/A DVT Evaluation: No evidence of DVT seen on physical exam. Calf/Ankle edema is present Labs: Lab Results  Component Value Date   WBC 17.8 (H) 01/31/2017   HGB 13.3 01/31/2017   HCT 39.2 01/31/2017   MCV 88.7 01/31/2017   PLT 255 01/31/2017   CMP Latest Ref Rng & Units 01/17/2017  Glucose 65 - 99 mg/dL 75  BUN 6 - 20 mg/dL 11  Creatinine 6.21 - 3.08 mg/dL 6.57  Sodium 846 - 962 mmol/L 139  Potassium 3.5 - 5.2 mmol/L 4.3  Chloride 96 - 106 mmol/L 104  CO2 20 - 29 mmol/L 22  Calcium 8.7 - 10.2 mg/dL 9.2  Total Protein 6.0 - 8.5 g/dL 1.6(X5.8(L)  Total Bilirubin 0.0 - 1.2 mg/dL 0.2  Alkaline Phos 39 - 117 IU/L 209(H)  AST 0 - 40 IU/L 89(H)  ALT 0 - 32 IU/L 162(H)    Discharge instruction: per After Visit Summary and "Baby and Me Booklet".  After Visit Meds:  Allergies as of 02/02/2017      Reactions   Latex Itching      Medication List    STOP taking these medications   DHA PO   glyBURIDE 5 MG tablet Commonly known as:  DIABETA   ursodiol 250 MG tablet Commonly known as:  ACTIGALL     TAKE these medications   acetaminophen 325 MG tablet Commonly known as:  TYLENOL Take 325 mg by mouth every 6 (six) hours as needed for mild pain, moderate pain, fever or headache.   calcium carbonate 500 MG chewable tablet Commonly known as:  TUMS - dosed in mg elemental calcium Chew 1-2 tablets by mouth 4 (four) times daily as needed for indigestion or heartburn.   ibuprofen 600 MG tablet Commonly known as:   ADVIL,MOTRIN Take 1 tablet (600 mg total) by mouth every 6 (six) hours.   prenatal vitamin w/FE, FA 27-1 MG Tabs tablet Take 1 tablet by mouth at bedtime.   senna-docusate 8.6-50 MG tablet Commonly known as:  Senokot-S Take 1 tablet by mouth at bedtime as needed for mild constipation.       Diet: routine diet  Activity: Advance as tolerated. Pelvic rest for 6 weeks.   Outpatient follow up:4-6 weeks Follow up Appt:Future Appointments Date Time Provider Department Center  03/02/2017 8:30 AM Cheral MarkerBooker, Sophya Vanblarcom R, CNM [facility]   Follow up visit: No Follow-up on file.  Postpartum contraception: Depo Provera  Newborn Data: Live born female  Birth Weight: 8 lb 5 oz (3771 g) APGAR: 7, 8  Baby Feeding: Bottle Disposition:home with mother   02/02/2017 [place] Shirley, DO  CNM attestation I have seen and examined this patient and agree with above documentation in the resident's note.   Cyndia SkeetersHannah M Taylor is a 31 y.o. [id number] s/p IOL for cholestasis.   Pain is well controlled.  Plan for birth control is oral contraceptives (estrogen/progesterone).  Method of Feeding: bottle  PE:  BP 128/67 (BP Location: Right Arm)   Pulse 78   Temp 97.8 F (36.6 C)   Resp 18   Ht 5\' 10"  (1.778 m)   Wt 117.9 kg (260 lb)   LMP 05/16/2016 (Approximate)   SpO2 98%   Breastfeeding? Unknown   BMI 37.31 kg/m  Fundus firm   Recent Labs  01/31/17 0122  HGB 13.3  HCT 39.2     Plan: discharge today - postpartum care discussed - f/u clinic in 4 weeks for postpartum visit   Cam HaiSHAW, Marlinda Miranda, CNM 9:08 AM 02/02/2017

## 2017-02-04 LAB — TYPE AND SCREEN
ABO/RH(D): O NEG
ANTIBODY SCREEN: POSITIVE
DAT, IGG: NEGATIVE
Unit division: 0
Unit division: 0

## 2017-02-04 LAB — BPAM RBC
BLOOD PRODUCT EXPIRATION DATE: 201808022359
BLOOD PRODUCT EXPIRATION DATE: 201808022359
UNIT TYPE AND RH: 9500
UNIT TYPE AND RH: 9500

## 2017-02-06 ENCOUNTER — Encounter: Payer: BLUE CROSS/BLUE SHIELD | Admitting: Women's Health

## 2017-02-06 ENCOUNTER — Other Ambulatory Visit: Payer: BLUE CROSS/BLUE SHIELD

## 2017-03-02 ENCOUNTER — Ambulatory Visit: Payer: BLUE CROSS/BLUE SHIELD | Admitting: Women's Health

## 2017-03-09 ENCOUNTER — Encounter: Payer: Self-pay | Admitting: Advanced Practice Midwife

## 2017-03-09 ENCOUNTER — Ambulatory Visit (INDEPENDENT_AMBULATORY_CARE_PROVIDER_SITE_OTHER): Payer: BLUE CROSS/BLUE SHIELD | Admitting: Advanced Practice Midwife

## 2017-03-09 MED ORDER — HYDROCHLOROTHIAZIDE 25 MG PO TABS: 25.0000 mg | ORAL_TABLET | Freq: Every day | ORAL | 0 refills | Status: DC

## 2017-03-09 NOTE — Progress Notes (Signed)
Deborah Taylor is a 31 y.o. who presents for a postpartum visit. She is 5 weeks postpartum following a spontaneous vaginal delivery. I have fully reviewed the prenatal and intrapartum course. The delivery was at 37.1 gestational weeks. IOL for cholestais. Also had A2DM  Anesthesia: epidural. Postpartum course has been uneventful. Baby's course has been uneventful. Baby is feeding by bottle. Bleeding: no bleeding. Bowel function is normal. Bladder function is normal. Patient is not sexually active. Contraception method is none. Postpartum depression screening: negative.   Current Outpatient Prescriptions:  .  acetaminophen (TYLENOL) 325 MG tablet, Take 325 mg by mouth every 6 (six) hours as needed for mild pain, moderate pain, fever or headache. , Disp: , Rfl:  .  calcium carbonate (TUMS - DOSED IN MG ELEMENTAL CALCIUM) 500 MG chewable tablet, Chew 1-2 tablets by mouth 4 (four) times daily as needed for indigestion or heartburn. , Disp: , Rfl:  .  glyBURIDE (DIABETA) 5 MG tablet, Take 5 mg by mouth daily with breakfast., Disp: , Rfl:  .  ibuprofen (ADVIL,MOTRIN) 600 MG tablet, Take 1 tablet (600 mg total) by mouth every 6 (six) hours., Disp: 30 tablet, Rfl: 0 .  prenatal vitamin w/FE, FA (PRENATAL 1 + 1) 27-1 MG TABS tablet, Take 1 tablet by mouth at bedtime. , Disp: , Rfl:  .  senna-docusate (SENOKOT-S) 8.6-50 MG tablet, Take 1 tablet by mouth at bedtime as needed for mild constipation. (Patient not taking: Reported on 03/09/2017), Disp: 30 tablet, Rfl: 0  Review of Systems   Constitutional: Negative for fever and chills Eyes: Negative for visual disturbances Respiratory: Negative for shortness of breath, dyspnea Cardiovascular: Negative for chest pain or palpitations  Gastrointestinal: Negative for vomiting, diarrhea and constipation Genitourinary: Negative for dysuria and urgency Musculoskeletal: Negative for back pain, joint pain, myalgias  Neurological: Negative for dizziness and headaches     Objective:     Vitals:   03/09/17 1612  BP: 110/70  Pulse: 78   General:  alert, cooperative and no distress   Breasts:  negative  Lungs: clear to auscultation bilaterally  Heart:  regular rate and rhythm  Abdomen: Soft, nontender   Vulva:  normal  Vagina: normal vagina  Cervix:  closed  Corpus: Well involuted     Rectal Exam: no hemorrhoids        Assessment:    normal postpartum exam.  Plan:   1. Contraception: IUD 2. Follow up in:  For Mirena or as needed.  3.  Check FBS several times a week for the next few weeks, let us know if > 90

## 2017-03-23 ENCOUNTER — Ambulatory Visit (INDEPENDENT_AMBULATORY_CARE_PROVIDER_SITE_OTHER): Payer: BLUE CROSS/BLUE SHIELD | Admitting: Advanced Practice Midwife

## 2017-03-23 VITALS — BP 120/60 | HR 74 | Wt 221.0 lb

## 2017-03-23 DIAGNOSIS — Z3202 Encounter for pregnancy test, result negative: Secondary | ICD-10-CM

## 2017-03-23 DIAGNOSIS — Z30430 Encounter for insertion of intrauterine contraceptive device: Secondary | ICD-10-CM | POA: Insufficient documentation

## 2017-03-23 LAB — POCT URINE PREGNANCY: PREG TEST UR: NEGATIVE

## 2017-03-23 MED ORDER — LEVONORGESTREL 20 MCG/24HR IU IUD
INTRAUTERINE_SYSTEM | Freq: Once | INTRAUTERINE | Status: AC
  Administered 2017-03-23: 13:00:00 via INTRAUTERINE

## 2017-03-23 NOTE — Addendum Note (Signed)
Addended by: Moss Mc on: 03/23/2017 01:17 PM   Modules accepted: Orders

## 2017-03-23 NOTE — Progress Notes (Signed)
Rekisha Morman Hoagland is a 31 y.o. year old  female   who presents for placement of a mirena IUD.  She recently delivered, hasn't had sex since then, and her pregnancy test today is negative.    The risks and benefits of the method and placement have been thouroughly reviewed with the patient and all questions were answered.  Specifically the patient is aware of failure rate of 08/998, expulsion of the IUD and of possible perforation.  The patient is aware of irregular bleeding due to the method and understands the incidence of irregular bleeding diminishes with time.  Time out was performed.  A Graves speculum was placed.  The cervix was prepped using Betadine. The uterus was found to be neutral and it sounded to 8 cm.  The cervix was grasped with a tenaculum and the IUD was inserted to 8 cm.  It was pulled back 1 cm and the IUD was disengaged.  The strings were trimmed to 3 cm.  Sonogram was performed and the proper placement of the IUD was verified.  The patient was instructed on signs and symptoms of infection and to check for the strings after each menses or each month.  The patient is to refrain from intercourse for 3 days.  The patient is scheduled for a return appointment after her first menses or 4 weeks.  FBS this past week were I the high 90's.  Will do 2 hr gtt.   CRESENZO-DISHMAN,Moni Rothrock 03/23/2017 11:46 AM

## 2017-03-28 ENCOUNTER — Other Ambulatory Visit: Payer: BLUE CROSS/BLUE SHIELD

## 2017-03-28 DIAGNOSIS — Z131 Encounter for screening for diabetes mellitus: Secondary | ICD-10-CM

## 2017-03-29 LAB — GLUCOSE TOLERANCE, 2 HOURS W/ 1HR
GLUCOSE, 1 HOUR: 120 mg/dL (ref 65–179)
GLUCOSE, 2 HOUR: 59 mg/dL — AB (ref 65–152)
Glucose, Fasting: 87 mg/dL (ref 65–91)

## 2017-03-30 ENCOUNTER — Encounter: Payer: Self-pay | Admitting: Women's Health

## 2017-03-30 DIAGNOSIS — Z8632 Personal history of gestational diabetes: Secondary | ICD-10-CM | POA: Insufficient documentation

## 2017-04-20 ENCOUNTER — Encounter: Payer: Self-pay | Admitting: Advanced Practice Midwife

## 2017-04-20 ENCOUNTER — Ambulatory Visit (INDEPENDENT_AMBULATORY_CARE_PROVIDER_SITE_OTHER): Payer: BLUE CROSS/BLUE SHIELD | Admitting: Advanced Practice Midwife

## 2017-04-20 VITALS — BP 140/70 | HR 85 | Ht 70.0 in | Wt 222.0 lb

## 2017-04-20 DIAGNOSIS — Z30431 Encounter for routine checking of intrauterine contraceptive device: Secondary | ICD-10-CM | POA: Diagnosis not present

## 2017-04-20 MED ORDER — MEGESTROL ACETATE 40 MG PO TABS: ORAL_TABLET | ORAL | 3 refills | Status: DC

## 2017-04-20 NOTE — Progress Notes (Signed)
History:  31 y.o. [id number] here today for today for IUD string check; Mirena IUD was placed  8/23. No complaints about the Mirena, no concerning side effects.other than bleeding for a week that has become annoying  The following portions of the patient's history were reviewed and updated as appropriate: allergies, current medications, past family history, past medical history, past social history, past surgical history and problem list.  Review of Systems:   Constitutional: Negative for fever and chills Eyes: Negative for visual disturbances Respiratory: Negative for shortness of breath, dyspnea Cardiovascular: Negative for chest pain or palpitations  Gastrointestinal: Negative for vomiting, diarrhea and constipation Genitourinary: Negative for dysuria and urgency Musculoskeletal: Negative for back pain, joint pain, myalgias  Neurological: Negative for dizziness and headaches    Objective:  Physical Exam unknown if currently breastfeeding. Is not breastfeeding Gen: NAD Abd: Soft, nontender and nondistended Bedside US reveals properlay placed IUD . cx very posterior, strings palpable.   Assessment & Plan:  Normal IUD check. Patient to keep IUD in place for five years; can come in for removal if she desires pregnancy within the next five years. Megace algorhithm prn bleeding Routine preventative health maintenance measures emphasized.

## 2020-04-02 ENCOUNTER — Other Ambulatory Visit: Payer: BLUE CROSS/BLUE SHIELD

## 2020-04-02 ENCOUNTER — Other Ambulatory Visit: Payer: Self-pay

## 2020-04-02 ENCOUNTER — Other Ambulatory Visit: Payer: Self-pay | Admitting: Critical Care Medicine

## 2020-04-02 DIAGNOSIS — Z20822 Contact with and (suspected) exposure to covid-19: Secondary | ICD-10-CM

## 2020-04-04 LAB — NOVEL CORONAVIRUS, NAA: SARS-CoV-2, NAA: NOT DETECTED

## 2020-06-02 ENCOUNTER — Other Ambulatory Visit: Payer: Self-pay

## 2020-06-02 ENCOUNTER — Ambulatory Visit
Admission: RE | Admit: 2020-06-02 | Discharge: 2020-06-02 | Disposition: A | Discharge status: Home / Self Care | Payer: BC Managed Care – PPO | Source: Ambulatory Visit | Attending: Emergency Medicine | Admitting: Emergency Medicine

## 2020-06-02 VITALS — BP 116/76 | HR 97 | Temp 98.7°F | Resp 20

## 2020-06-02 DIAGNOSIS — J069 Acute upper respiratory infection, unspecified: Secondary | ICD-10-CM | POA: Diagnosis not present

## 2020-06-02 MED ORDER — BENZONATATE 100 MG PO CAPS: 100.0000 mg | ORAL_CAPSULE | Freq: Three times a day (TID) | ORAL | 0 refills | Status: DC

## 2020-06-02 MED ORDER — AZITHROMYCIN 250 MG PO TABS: 250.0000 mg | ORAL_TABLET | Freq: Every day | ORAL | 0 refills | Status: DC

## 2020-06-02 MED ORDER — CETIRIZINE HCL 10 MG PO TABS: 10.0000 mg | ORAL_TABLET | Freq: Every day | ORAL | 0 refills | Status: DC

## 2020-06-02 MED ORDER — DEXAMETHASONE 4 MG PO TABS: 4.0000 mg | ORAL_TABLET | Freq: Every day | ORAL | 0 refills | Status: AC

## 2020-06-02 NOTE — Discharge Instructions (Signed)
°

## 2020-06-02 NOTE — ED Triage Notes (Signed)
Pt presents with complaints of cough, headache, body aches, fever, runny nose, loss of taste x 2 days. Reports sinus symptoms x 2 weeks. COVID test is pending.

## 2020-06-02 NOTE — ED Provider Notes (Addendum)
[facility]   [id number] 06/02/20 Arrival Time: 1154   CC: URI  SUBJECTIVE: History from: patient.  Deborah Taylor is a 34 y.o. female who resented to the urgent care with a complaint of cough, nasal congestion, headache, body ache, loss of taste and smell for the past 2 days.  Reports she has been having sinus symptoms for the past 2 weeks.  Reported her nasal discharge is green and dark brown.  Denies sick exposure to COVID, flu or strep.  Denies recent travel.  Has tried OTC medication without relief.  Denies aggravating factor.  Denies previous symptoms in the past.   Denies fever, chills, fatigue, sinus pain, rhinorrhea, sore throat, SOB, wheezing, chest pain, nausea, changes in bowel or bladder habits.    ROS: As per HPI.  All other pertinent ROS negative.      Past Medical History:  Diagnosis Date  . Medical history non-contributory    Past Surgical History:  Procedure Laterality Date  . NO PAST SURGERIES     Allergies  Allergen Reactions  . Latex Itching   No current facility-administered medications on file prior to encounter.   Current Outpatient Medications on File Prior to Encounter  Medication Sig Dispense Refill  . acetaminophen (TYLENOL) 325 MG tablet Take 325 mg by mouth every 6 (six) hours as needed for mild pain, moderate pain, fever or headache.     . calcium carbonate (TUMS - DOSED IN MG ELEMENTAL CALCIUM) 500 MG chewable tablet Chew 1-2 tablets by mouth 4 (four) times daily as needed for indigestion or heartburn.     . glyBURIDE (DIABETA) 5 MG tablet Take 5 mg by mouth daily with breakfast.    . ibuprofen (ADVIL,MOTRIN) 600 MG tablet Take 1 tablet (600 mg total) by mouth every 6 (six) hours. (Patient taking differently: Take 600 mg by mouth as needed. ) 30 tablet 0  . levonorgestrel (MIRENA) 20 MCG/24HR IUD 1 each by Intrauterine route once.    . megestrol (MEGACE) 40 MG tablet Take 3/day (at the same time) for 5 days; 2/day for 5 days, then  1/day PO prn bleeding 60 tablet 3   Social History   Socioeconomic History  . Marital status: Married    Spouse name: Not on file  . Number of children: Not on file  . Years of education: Not on file  . Highest education level: Not on file  Occupational History  . Not on file  Tobacco Use  . Smoking status: Current Every Day Smoker    Packs/day: 0.25    Years: 10.00    Pack years: 2.50    Types: Cigarettes  . Smokeless tobacco: Never Used  Substance and Sexual Activity  . Alcohol use: No  . Drug use: No  . Sexual activity: Yes    Birth control/protection: I.U.D.  Other Topics Concern  . Not on file  Social History Narrative  . Not on file   Social Determinants of Health   Financial Resource Strain:   . Difficulty of Paying Living Expenses: Not on file  Food Insecurity:   . Worried About Programme researcher, broadcasting/film/video in the Last Year: Not on file  . Ran Out of Food in the Last Year: Not on file  Transportation Needs:   . Lack of Transportation (Medical): Not on file  . Lack of Transportation (Non-Medical): Not on file  Physical Activity:   . Days of Exercise per Week: Not on file  . Minutes of Exercise per Session:  Not on file  Stress:   . Feeling of Stress : Not on file  Social Connections:   . Frequency of Communication with Friends and Family: Not on file  . Frequency of Social Gatherings with Friends and Family: Not on file  . Attends Religious Services: Not on file  . Active Member of Clubs or Organizations: Not on file  . Attends Banker Meetings: Not on file  . Marital Status: Not on file  Intimate Partner Violence:   . Fear of Current or Ex-Partner: Not on file  . Emotionally Abused: Not on file  . Physically Abused: Not on file  . Sexually Abused: Not on file   Family History  Problem Relation Age of Onset  . Cancer Paternal Grandfather        liver cancer  . Stroke Paternal Grandmother   . Tremor Paternal Grandmother   . Diabetes Maternal  Grandmother   . Cancer Maternal Grandmother        skin cancer  . Heart disease Maternal Grandmother   . Diabetes Maternal Grandfather   . Heart disease Maternal Grandfather   . Cancer Maternal Grandfather        skin cancer  . Kidney Stones Father   . Diabetes Mother   . Cancer Brother        skin cancer  . Cancer Maternal Aunt        pancreatic cancer    OBJECTIVE:  Vitals:   06/02/20 1226  BP: 116/76  Pulse: 97  Resp: 20  Temp: 98.7 F (37.1 C)  SpO2: 98%     General appearance: alert; appears fatigued, but nontoxic; speaking in full sentences and tolerating own secretions HEENT: NCAT; Ears: EACs clear, TMs pearly gray; Eyes: PERRL.  EOM grossly intact. Sinuses: nontender; Nose: nares patent without rhinorrhea, Throat: oropharynx clear, tonsils non erythematous or enlarged, uvula midline  Neck: supple without LAD Lungs: unlabored respirations, symmetrical air entry; cough: moderate; no respiratory distress; CTAB Heart: regular rate and rhythm.  Radial pulses 2+ symmetrical bilaterally Skin: warm and dry Psychological: alert and cooperative; normal mood and affect  LABS:  No results found for this or any previous visit (from the past 24 hour(s)).   ASSESSMENT & PLAN:  1. Acute URI     Meds ordered this encounter  Medications  . benzonatate (TESSALON) 100 MG capsule    Sig: Take 1 capsule (100 mg total) by mouth every 8 (eight) hours.    Dispense:  30 capsule    Refill:  0  . cetirizine (ZYRTEC ALLERGY) 10 MG tablet    Sig: Take 1 tablet (10 mg total) by mouth daily.    Dispense:  30 tablet    Refill:  0  . dexamethasone (DECADRON) 4 MG tablet    Sig: Take 1 tablet (4 mg total) by mouth daily for 7 days.    Dispense:  7 tablet    Refill:  0  . azithromycin (ZITHROMAX) 250 MG tablet    Sig: Take 1 tablet (250 mg total) by mouth daily. Take first 2 tablets together, then 1 every day until finished.    Dispense:  6 tablet    Refill:  0    Discharge  instructions  Get plenty of rest and push fluids Tessalon Perles prescribed for cough Zyrtec for nasal congestion, runny nose, and/or sore throat Decadron was prescribed Azithromycin was prescribed Use medications daily for symptom relief Use OTC medications like ibuprofen or tylenol as needed fever or  pain Call or go to the ED if you have any new or worsening symptoms such as fever, worsening cough, shortness of breath, chest tightness, chest pain, turning blue, changes in mental status, etc...   Reviewed expectations re: course of current medical issues. Questions answered. Outlined signs and symptoms indicating need for more acute intervention. Patient verbalized understanding. After Visit Summary given.         Durward Parcel, FNP 06/02/20 1312    Durward Parcel, FNP 06/02/20 1313

## 2020-06-11 ENCOUNTER — Other Ambulatory Visit: Payer: Self-pay

## 2020-09-03 ENCOUNTER — Other Ambulatory Visit: Payer: BC Managed Care – PPO

## 2020-09-03 DIAGNOSIS — Z20822 Contact with and (suspected) exposure to covid-19: Secondary | ICD-10-CM

## 2020-09-04 LAB — SARS-COV-2, NAA 2 DAY TAT

## 2020-09-04 LAB — NOVEL CORONAVIRUS, NAA: SARS-CoV-2, NAA: NOT DETECTED

## 2022-05-23 ENCOUNTER — Other Ambulatory Visit (HOSPITAL_COMMUNITY)
Admission: RE | Admit: 2022-05-23 | Discharge: 2022-05-23 | Disposition: A | Discharge status: Home / Self Care | Payer: 59 | Source: Ambulatory Visit | Attending: Obstetrics & Gynecology | Admitting: Obstetrics & Gynecology

## 2022-05-23 ENCOUNTER — Ambulatory Visit (INDEPENDENT_AMBULATORY_CARE_PROVIDER_SITE_OTHER): Payer: 59 | Admitting: Obstetrics & Gynecology

## 2022-05-23 ENCOUNTER — Encounter: Payer: Self-pay | Admitting: Obstetrics & Gynecology

## 2022-05-23 VITALS — BP 113/70 | HR 73 | Ht 69.25 in | Wt 175.0 lb

## 2022-05-23 DIAGNOSIS — R69 Illness, unspecified: Secondary | ICD-10-CM | POA: Diagnosis not present

## 2022-05-23 DIAGNOSIS — N938 Other specified abnormal uterine and vaginal bleeding: Secondary | ICD-10-CM | POA: Diagnosis not present

## 2022-05-23 DIAGNOSIS — Z113 Encounter for screening for infections with a predominantly sexual mode of transmission: Secondary | ICD-10-CM

## 2022-05-23 DIAGNOSIS — Z01419 Encounter for gynecological examination (general) (routine) without abnormal findings: Secondary | ICD-10-CM | POA: Insufficient documentation

## 2022-05-23 NOTE — Addendum Note (Signed)
Addended by: Jesusita Oka on: 05/23/2022 10:28 AM   Modules accepted: Orders

## 2022-05-23 NOTE — Progress Notes (Signed)
Subjective:     Deborah Taylor is a 36 y.o. female here for a routine exam.  No LMP recorded. (Menstrual status: IUD). [id number] Birth Control Method:  Mirena IUD Menstrual Calendar(currently): irregular for the past year with increased bleeding and cramping, reppeared 1 year ago  Current complaints: menses.   Current acute medical issues:     Recent Gynecologic History No LMP recorded. (Menstrual status: IUD). Last Pap: 2018,  normal Last mammogram: ,    Past Medical History:  Diagnosis Date   Medical history non-contributory     Past Surgical History:  Procedure Laterality Date   NO PAST SURGERIES      OB History     Gravida  2   Para  1   Term  1   Preterm      AB  1   Living  1      SAB      IAB  1   Ectopic      Multiple  0   Live Births  1           Social History   Socioeconomic History   Marital status: Divorced    Spouse name: Not on file   Number of children: Not on file   Years of education: Not on file   Highest education level: Not on file  Occupational History   Not on file  Tobacco Use   Smoking status: Some Days    Packs/day: 0.25    Years: 10.00    Total pack years: 2.50    Types: Cigarettes   Smokeless tobacco: Never  Vaping Use   Vaping Use: Some days  Substance and Sexual Activity   Alcohol use: No   Drug use: No   Sexual activity: Yes    Birth control/protection: I.U.D.  Other Topics Concern   Not on file  Social History Narrative   Not on file   Social Determinants of Health   Financial Resource Strain: Low Risk  (05/23/2022)   Overall Financial Resource Strain (CARDIA)    Difficulty of Paying Living Expenses: Not very hard  Food Insecurity: No Food Insecurity (05/23/2022)   Hunger Vital Sign    Worried About Running Out of Food in the Last Year: Never true    Ran Out of Food in the Last Year: Never true  Transportation Needs: No Transportation Needs (05/23/2022)   PRAPARE - Scientist, research (physical sciences) (Medical): No    Lack of Transportation (Non-Medical): No  Physical Activity: Insufficiently Active (05/23/2022)   Exercise Vital Sign    Days of Exercise per Week: 3 days    Minutes of Exercise per Session: 40 min  Stress: Stress Concern Present (05/23/2022)   [employer] of Occupational Health - Occupational Stress Questionnaire    Feeling of Stress : Very much  Social Connections: Socially Isolated (05/23/2022)   Social Connection and Isolation Panel [NHANES]    Frequency of Communication with Friends and Family: More than three times a week    Frequency of Social Gatherings with Friends and Family: Once a week    Attends Religious Services: Never    Database administrator or Organizations: No    Attends Banker Meetings: Never    Marital Status: Divorced    Family History  Problem Relation Age of Onset   Cancer Paternal Grandfather        liver cancer   Stroke Paternal Grandmother    Tremor Paternal  Grandmother    Diabetes Maternal Grandmother    Cancer Maternal Grandmother        skin cancer   Heart disease Maternal Grandmother    Diabetes Maternal Grandfather    Heart disease Maternal Grandfather    Cancer Maternal Grandfather        skin cancer   Kidney Stones Father    Diabetes Mother    Cancer Brother        skin cancer   Cancer Maternal Aunt        pancreatic cancer     Current Outpatient Medications:    levonorgestrel (MIRENA) 20 MCG/24HR IUD, 1 each by Intrauterine route once., Disp: , Rfl:    cetirizine (ZYRTEC ALLERGY) 10 MG tablet, Take 1 tablet (10 mg total) by mouth daily. (Patient not taking: Reported on 05/23/2022), Disp: 30 tablet, Rfl: 0  Review of Systems  Review of Systems  Constitutional: Negative for fever, chills, weight loss, malaise/fatigue and diaphoresis.  HENT: Negative for hearing loss, ear pain, nosebleeds, congestion, sore throat, neck pain, tinnitus and ear discharge.   Eyes: Negative for blurred  vision, double vision, photophobia, pain, discharge and redness.  Respiratory: Negative for cough, hemoptysis, sputum production, shortness of breath, wheezing and stridor.   Cardiovascular: Negative for chest pain, palpitations, orthopnea, claudication, leg swelling and PND.  Gastrointestinal: negative for abdominal pain. Negative for heartburn, nausea, vomiting, diarrhea, constipation, blood in stool and melena.  Genitourinary: Negative for dysuria, urgency, frequency, hematuria and flank pain.  Musculoskeletal: Negative for myalgias, back pain, joint pain and falls.  Skin: Negative for itching and rash.  Neurological: Negative for dizziness, tingling, tremors, sensory change, speech change, focal weakness, seizures, loss of consciousness, weakness and headaches.  Endo/Heme/Allergies: Negative for environmental allergies and polydipsia. Does not bruise/bleed easily.  Psychiatric/Behavioral: Negative for depression, suicidal ideas, hallucinations, memory loss and substance abuse. The patient is not nervous/anxious and does not have insomnia.        Objective:  Blood pressure 113/70, pulse 73, height 5' 9.25" (1.759 m), weight 175 lb (79.4 kg).   Physical Exam  Vitals reviewed. Constitutional: She is oriented to person, place, and time. She appears well-developed and well-nourished.  HENT:  Head: Normocephalic and atraumatic.        Right Ear: External ear normal.  Left Ear: External ear normal.  Nose: Nose normal.  Mouth/Throat: Oropharynx is clear and moist.  Eyes: Conjunctivae and EOM are normal. Pupils are equal, round, and reactive to light. Right eye exhibits no discharge. Left eye exhibits no discharge. No scleral icterus.  Neck: Normal range of motion. Neck supple. No tracheal deviation present. No thyromegaly present.  Cardiovascular: Normal rate, regular rhythm, normal heart sounds and intact distal pulses.  Exam reveals no gallop and no friction rub.   No murmur  heard. Respiratory: Effort normal and breath sounds normal. No respiratory distress. She has no wheezes. She has no rales. She exhibits no tenderness.  GI: Soft. Bowel sounds are normal. She exhibits no distension and no mass. There is no tenderness. There is no rebound and no guarding.  Genitourinary:  Breasts no masses skin changes or nipple changes bilaterally      Vulva is normal without lesions Vagina is pink moist without discharge Cervix normal in appearance and pap is done Uterus is normal size shape and contour Adnexa is negative with normal sized ovaries   Musculoskeletal: Normal range of motion. She exhibits no edema and no tenderness.  Neurological: She is alert and oriented to  person, place, and time. She has normal reflexes. She displays normal reflexes. No cranial nerve deficit. She exhibits normal muscle tone. Coordination normal.  Skin: Skin is warm and dry. No rash noted. No erythema. No pallor.  Psychiatric: She has a normal mood and affect. Her behavior is normal. Judgment and thought content normal.       Medications Ordered at today's visit: No orders of the defined types were placed in this encounter.   Other orders placed at today's visit: No orders of the defined types were placed in this encounter.     Assessment:    Normal Gyn exam.   Dysmenorrhea DUB Plan:    Contraception: IUD. Follow up in: prn for Mirena removal and reinsertion(it's early but we want to suppress her endometrium not just provide BCM) prn.     Return if symptoms worsen or fail to improve, for schedule on a MOnday for Mirena removal + reinsertion.

## 2022-05-24 LAB — CYTOLOGY - PAP
Chlamydia: NEGATIVE
Comment: NEGATIVE
Comment: NEGATIVE
Comment: NORMAL
Diagnosis: NEGATIVE
High risk HPV: NEGATIVE
Neisseria Gonorrhea: NEGATIVE

## 2022-07-04 ENCOUNTER — Ambulatory Visit: Payer: 59 | Admitting: Obstetrics & Gynecology

## 2022-07-04 ENCOUNTER — Encounter: Payer: Self-pay | Admitting: Obstetrics & Gynecology

## 2022-07-04 VITALS — BP 118/67 | HR 70 | Ht 69.0 in | Wt 175.0 lb

## 2022-07-04 DIAGNOSIS — Z30433 Encounter for removal and reinsertion of intrauterine contraceptive device: Secondary | ICD-10-CM

## 2022-07-04 MED ORDER — LEVONORGESTREL 20 MCG/DAY IU IUD
1.0000 | INTRAUTERINE_SYSTEM | Freq: Once | INTRAUTERINE | Status: AC
  Administered 2022-07-04: 1 via INTRAUTERINE

## 2022-07-04 NOTE — Progress Notes (Signed)
IUD Removal + ReInsertion Procedure Note  Pre-operative Diagnosis: DUB                                             dysmenorrhea  Post-operative Diagnosis: same  Indications: contraception  Procedure Details  Urine pregnancy test was not done.  The risks (including infection, bleeding, pain, and uterine perforation) and benefits of the procedure were explained to the patient and Written informed consent was obtained.    Cervix cleansed with Betadine.  Single tooth tenaculum was placed on the anterior cervix A straight Nicholaus Bloom was used and the Mirena IUD was removed without difficulty. Uterus sounded to 9 cm. IUD inserted without difficulty. String visible and trimmed. Patient tolerated procedure well.  IUD Information: Toney Reil, Lot # [id number] Expiration date October 2025.  Condition: Stable  Complications: None  Plan:  The patient was advised to call for any fever or for prolonged or severe pain or bleeding. She was advised to use OTC ibuprofen as needed for mild to moderate pain.   Attending Physician Documentation: I performed the procedure

## 2022-07-04 NOTE — Addendum Note (Signed)
Addended by: Dereck Ligas on: 07/04/2022 09:22 AM   Modules accepted: Orders

## 2023-11-28 ENCOUNTER — Other Ambulatory Visit: Payer: Self-pay

## 2023-11-28 ENCOUNTER — Ambulatory Visit
Admission: EM | Admit: 2023-11-28 | Discharge: 2023-11-28 | Disposition: A | Discharge status: Home / Self Care | Attending: Family Medicine | Admitting: Family Medicine

## 2023-11-28 ENCOUNTER — Encounter: Payer: Self-pay | Admitting: Emergency Medicine

## 2023-11-28 DIAGNOSIS — Z113 Encounter for screening for infections with a predominantly sexual mode of transmission: Secondary | ICD-10-CM | POA: Insufficient documentation

## 2023-11-28 DIAGNOSIS — N9089 Other specified noninflammatory disorders of vulva and perineum: Secondary | ICD-10-CM | POA: Diagnosis not present

## 2023-11-28 MED ORDER — VALACYCLOVIR HCL 1 G PO TABS: 1000.0000 mg | ORAL_TABLET | Freq: Two times a day (BID) | ORAL | 0 refills | Status: AC

## 2023-11-28 NOTE — ED Provider Notes (Signed)
 [facility]    CSN: [id number] Arrival date & time: 11/28/23  0835      History   Chief Complaint Chief Complaint  Patient presents with   Exposure to STD    HPI Deborah Taylor is a 38 y.o. female.   Presenting today with multiple blisters/ulcers to the vaginal region for the past week or so.  She states some burning sensation to the area, particular with urine passage.  Has recently changed detergent, fabric softener and body wash so is unsure if this may be the culprit or she did reuse a razor blade which is new for her as she typically would change the razor out after each shave.  She also recently found out that her significant other had multiple other partners so she is wanting STD screening.  So far not trying anything over-the-counter for symptoms.  Denies pelvic or abdominal pain, dysuria, hematuria, flank pain, fevers.    Past Medical History:  Diagnosis Date   Medical history non-contributory     Patient Active Problem List   Diagnosis Date Noted   History of gestational diabetes 03/30/2017   Encounter for IUD insertion 03/23/2017   Rubella non-immune status, antepartum 07/13/2016   Smoker 07/12/2016    Past Surgical History:  Procedure Laterality Date   NO PAST SURGERIES      OB History     Gravida  2   Para  1   Term  1   Preterm      AB  1   Living  1      SAB      IAB  1   Ectopic      Multiple  0   Live Births  1            Home Medications    Prior to Admission medications   Medication Sig Start Date End Date Taking? Authorizing Provider  valACYclovir (VALTREX) 1000 MG tablet Take 1 tablet (1,000 mg total) by mouth 2 (two) times daily for 10 days. 11/28/23 12/08/23 Yes Corbin Dess, PA-C  cetirizine  (ZYRTEC  ALLERGY) 10 MG tablet Take 1 tablet (10 mg total) by mouth daily. Patient not taking: Reported on 05/23/2022 06/02/20   Avegno, Komlanvi S, FNP  levonorgestrel  (MIRENA ) 20 MCG/24HR IUD 1 each by  Intrauterine route once.    [provider]    Family History Family History  Problem Relation Age of Onset   Cancer Paternal Grandfather        liver cancer   Stroke Paternal Grandmother    Tremor Paternal Grandmother    Diabetes Maternal Grandmother    Cancer Maternal Grandmother        skin cancer   Heart disease Maternal Grandmother    Diabetes Maternal Grandfather    Heart disease Maternal Grandfather    Cancer Maternal Grandfather        skin cancer   Kidney Stones Father    Diabetes Mother    Cancer Brother        skin cancer   Cancer Maternal Aunt        pancreatic cancer    Social History Social History   Tobacco Use   Smoking status: Former    Current packs/day: 0.25    Average packs/day: 0.3 packs/day for 10.0 years (2.5 ttl pk-yrs)    Types: Cigarettes   Smokeless tobacco: Never  Vaping Use   Vaping status: Some Days  Substance Use Topics   Alcohol use: No  Drug use: No     Allergies   Latex   Review of Systems Review of Systems PER HPI  Physical Exam Triage Vital Signs ED Triage Vitals  Encounter Vitals Group     BP 11/28/23 0929 134/84     Systolic BP Percentile --      Diastolic BP Percentile --      Pulse Rate 11/28/23 0929 87     Resp 11/28/23 0929 20     Temp 11/28/23 0929 98.9 F (37.2 C)     Temp Source 11/28/23 0929 Oral     SpO2 11/28/23 0929 98 %     Weight --      Height --      Head Circumference --      Peak Flow --      Pain Score 11/28/23 0927 2     Pain Loc --      Pain Education --      Exclude from Growth Chart --    No data found.  Updated Vital Signs BP 134/84 (BP Location: Right Arm)   Pulse 87   Temp 98.9 F (37.2 C) (Oral)   Resp 20   LMP 11/14/2023 (Approximate)   SpO2 98%   Visual Acuity Right Eye Distance:   Left Eye Distance:   Bilateral Distance:    Right Eye Near:   Left Eye Near:    Bilateral Near:     Physical Exam Vitals and nursing note reviewed. Exam conducted with a  chaperone present.  Constitutional:      Appearance: Normal appearance. She is not ill-appearing.  HENT:     Head: Atraumatic.  Eyes:     Extraocular Movements: Extraocular movements intact.     Conjunctiva/sclera: Conjunctivae normal.  Cardiovascular:     Rate and Rhythm: Normal rate.  Pulmonary:     Effort: Pulmonary effort is normal.  Genitourinary:    Comments: No abnormal discharge noted, multiple erythematous lesions to the labial region and the vaginal introitus Musculoskeletal:        General: Normal range of motion.     Cervical back: Normal range of motion and neck supple.  Skin:    General: Skin is warm and dry.  Neurological:     Mental Status: She is alert and oriented to person, place, and time.  Psychiatric:        Mood and Affect: Mood normal.        Thought Content: Thought content normal.        Judgment: Judgment normal.      UC Treatments / Results  Labs (all labs ordered are listed, but only abnormal results are displayed) Labs Reviewed  HSV 1/2 PCR (SURFACE)  CERVICOVAGINAL ANCILLARY ONLY    EKG   Radiology No results found.  Procedures Procedures (including critical care time)  Medications Ordered in UC Medications - No data to display  Initial Impression / Assessment and Plan / UC Course  I have reviewed the triage vital signs and the nursing notes.  Pertinent labs & imaging results that were available during my care of the patient were reviewed by me and considered in my medical decision making (see chart for details).     Exam findings suspicious for either shave irritation or HSV.  No evidence of a bacterial infection today.  HSV swab and gonorrhea, chlamydia, trichomonas screening all pending.  Trial Valtrex while awaiting results and adjust if needed.  Discussed Vaseline or Aquaphor over the lesions to protect from  urine passage irritation.  Return for worsening symptoms.  Final Clinical Impressions(s) / UC Diagnoses   Final  diagnoses:  Labial lesion  Screening examination for STI     Discharge Instructions      Apply Aquaphor or Vaseline generously to the irritated areas to keep urine from irritating the areas further.  Keep the areas clean, avoid shaving until resolved.  I have also sent in an antiviral medication called Valtrex in case these lesions are related to a virus such as HSV while we wait for your results to return.  Follow-up for worsening symptoms    ED Prescriptions     Medication Sig Dispense Auth. Provider   valACYclovir (VALTREX) 1000 MG tablet Take 1 tablet (1,000 mg total) by mouth 2 (two) times daily for 10 days. 20 tablet Corbin Dess, [place]      PDMP not reviewed this encounter.   Corbin Dess, [place] 11/28/23 1034

## 2023-11-28 NOTE — ED Notes (Addendum)
 Urine specimen was collected. Provider discontinued order prior to result being entered in.

## 2023-11-28 NOTE — Discharge Instructions (Signed)
 Apply Aquaphor or Vaseline generously to the irritated areas to keep urine from irritating the areas further.  Keep the areas clean, avoid shaving until resolved.  I have also sent in an antiviral medication called Valtrex in case these lesions are related to a virus such as HSV while we wait for your results to return.  Follow-up for worsening symptoms

## 2023-11-28 NOTE — ED Notes (Addendum)
 Pt obtaining urine sample and vaginal swab post triage in case sample is needed. Pt aware to clean, obtain vaginal swab, and then urinate. Pt verbalized understanding.

## 2023-11-28 NOTE — ED Triage Notes (Signed)
 Pt reports "bumps to vaginal area". Reports intermittent vaginal itching and burning sensation.denies discharge.   Reports has recently changes laundry detergent, fabric softener,and body wash.  Also states recently found out partner has had several partners.last unprotected intercourse beginning of April.

## 2023-11-29 LAB — CERVICOVAGINAL ANCILLARY ONLY
Chlamydia: NEGATIVE
Comment: NEGATIVE
Comment: NEGATIVE
Comment: NORMAL
Neisseria Gonorrhea: NEGATIVE
Trichomonas: NEGATIVE

## 2023-11-29 LAB — HSV 1/2 PCR (SURFACE)
HSV-1 DNA: NOT DETECTED
HSV-2 DNA: DETECTED — AB

## 2023-12-06 ENCOUNTER — Ambulatory Visit
Admission: EM | Admit: 2023-12-06 | Discharge: 2023-12-06 | Disposition: A | Discharge status: Home / Self Care | Attending: Nurse Practitioner | Admitting: Nurse Practitioner

## 2023-12-06 DIAGNOSIS — B009 Herpesviral infection, unspecified: Secondary | ICD-10-CM

## 2023-12-06 MED ORDER — VALACYCLOVIR HCL 1 G PO TABS: 1000.0000 mg | ORAL_TABLET | Freq: Every day | ORAL | 0 refills | Status: AC

## 2023-12-06 NOTE — ED Provider Notes (Signed)
 [facility]    CSN: [id number] Arrival date & time: 12/06/23  1054      History   Chief Complaint No chief complaint on file.   HPI Deborah Taylor is a 38 y.o. female.   Patient presents requesting assistance with medication management-was seen here last week on 11/20/2023 for a vaginal rash.  HSV-2 testing confirmed positive.  She was started on valacyclovir  1 g twice daily for 10 days.  Patient states that the rash and symptoms are improving-however, after some research that she has done-is desiring suppressive therapy until she can talk about ongoing management with her primary care provider.  She is establishing care the new provider but the appointment is not till July 2025.  Overall, patient is still tearful and trying to come to terms with this new diagnosis.  The history is provided by the patient.    Past Medical History:  Diagnosis Date   Medical history non-contributory     Patient Active Problem List   Diagnosis Date Noted   History of gestational diabetes 03/30/2017   Encounter for IUD insertion 03/23/2017   Rubella non-immune status, antepartum 07/13/2016   Smoker 07/12/2016    Past Surgical History:  Procedure Laterality Date   NO PAST SURGERIES      OB History     Gravida  2   Para  1   Term  1   Preterm      AB  1   Living  1      SAB      IAB  1   Ectopic      Multiple  0   Live Births  1            Home Medications    Prior to Admission medications   Medication Sig Start Date End Date Taking? Authorizing Provider  valACYclovir  (VALTREX ) 1000 MG tablet Take 1 tablet (1,000 mg total) by mouth daily. 12/06/23 01/05/24 Yes Genene Kennel, FNP  cetirizine  (ZYRTEC  ALLERGY) 10 MG tablet Take 1 tablet (10 mg total) by mouth daily. Patient not taking: Reported on 05/23/2022 06/02/20   Avegno, Komlanvi S, FNP  levonorgestrel  (MIRENA ) 20 MCG/24HR IUD 1 each by Intrauterine route once.    [provider]   valACYclovir  (VALTREX ) 1000 MG tablet Take 1 tablet (1,000 mg total) by mouth 2 (two) times daily for 10 days. 11/28/23 12/08/23  Corbin Dess, PA-C    Family History Family History  Problem Relation Age of Onset   Cancer Paternal Grandfather        liver cancer   Stroke Paternal Grandmother    Tremor Paternal Grandmother    Diabetes Maternal Grandmother    Cancer Maternal Grandmother        skin cancer   Heart disease Maternal Grandmother    Diabetes Maternal Grandfather    Heart disease Maternal Grandfather    Cancer Maternal Grandfather        skin cancer   Kidney Stones Father    Diabetes Mother    Cancer Brother        skin cancer   Cancer Maternal Aunt        pancreatic cancer    Social History Social History   Tobacco Use   Smoking status: Former    Current packs/day: 0.25    Average packs/day: 0.3 packs/day for 10.0 years (2.5 ttl pk-yrs)    Types: Cigarettes   Smokeless tobacco: Never  Vaping Use   Vaping status:  Some Days  Substance Use Topics   Alcohol use: No   Drug use: No     Allergies   Latex   Review of Systems Review of Systems  Constitutional:  Negative for chills, fatigue and fever.  HENT:  Negative for congestion, rhinorrhea and sore throat.   Eyes:  Negative for pain and redness.  Respiratory:  Negative for cough and shortness of breath.   Cardiovascular:  Negative for chest pain and palpitations.  Gastrointestinal:  Negative for abdominal pain, diarrhea and vomiting.  Genitourinary:  Positive for genital sores. Negative for dysuria, vaginal bleeding and vaginal discharge.  Musculoskeletal:  Negative for arthralgias and myalgias.  Skin:  Negative for wound.  Neurological:  Negative for dizziness and headaches.     Physical Exam Triage Vital Signs ED Triage Vitals  Encounter Vitals Group     BP 12/06/23 1127 130/80     Systolic BP Percentile --      Diastolic BP Percentile --      Pulse Rate 12/06/23 1127 78     Resp  12/06/23 1127 16     Temp 12/06/23 1127 98.2 F (36.8 C)     Temp Source 12/06/23 1127 Oral     SpO2 12/06/23 1127 98 %     Weight --      Height --      Head Circumference --      Peak Flow --      Pain Score 12/06/23 1130 0     Pain Loc --      Pain Education --      Exclude from Growth Chart --    No data found.  Updated Vital Signs BP 130/80 (BP Location: Right Arm)   Pulse 78   Temp 98.2 F (36.8 C) (Oral)   Resp 16   LMP 12/02/2023 (Approximate)   SpO2 98%   Physical Exam Vitals and nursing note reviewed.  Constitutional:      Appearance: Normal appearance.  Cardiovascular:     Rate and Rhythm: Normal rate and regular rhythm.     Heart sounds: Normal heart sounds.  Pulmonary:     Effort: Pulmonary effort is normal.     Breath sounds: Normal breath sounds.  Abdominal:     General: Bowel sounds are normal.  Musculoskeletal:        General: Normal range of motion.  Skin:    General: Skin is warm and dry.  Neurological:     General: No focal deficit present.     Mental Status: She is alert and oriented to person, place, and time.  Psychiatric:        Mood and Affect: Mood normal.        Behavior: Behavior normal.        Thought Content: Thought content normal.        Judgment: Judgment normal.     Comments: Tearful at times.    UC Treatments / Results  Labs (all labs ordered are listed, but only abnormal results are displayed) Labs Reviewed - No data to display  EKG   Radiology No results found.  Procedures Procedures (including critical care time)  Medications Ordered in UC Medications - No data to display  Initial Impression / Assessment and Plan / UC Course  I have reviewed the triage vital signs and the nursing notes.  Pertinent labs & imaging results that were available during my care of the patient were reviewed by me and considered in my medical decision  making (see chart for details).     Patient currently undergoing treatment for  a new diagnosis of HSV-2 infection.  She states that the rash and symptoms are improving-therefore, any further genital/pelvic exams were deferred today.  We discussed ongoing management of an infection such as this and the necessity for follow-up with primary care provider.  I have prescribed a 30-day supply of suppressive Valtrex  therapy for her to use while bridging the gap between urgent care and primary care.  I have encouraged her to utilize virtual care as they may be able to assist with bridging her care as well.  Continue to follow the treatment guidelines for this diagnosis.  Final Clinical Impressions(s) / UC Diagnoses   Final diagnoses:  Herpes simplex type 2 infection     Discharge Instructions      Begin daily maintenance therapy once your original prescription for Valtrex  has been completed.  Follow up with your new primary care provider for ongoing care and management.     ED Prescriptions     Medication Sig Dispense Auth. Provider   valACYclovir  (VALTREX ) 1000 MG tablet Take 1 tablet (1,000 mg total) by mouth daily. 30 tablet Genene Kennel, FNP      PDMP not reviewed this encounter.   Genene Kennel, FNP 12/06/23 1321

## 2023-12-06 NOTE — Discharge Instructions (Addendum)
 Begin daily maintenance therapy once your original prescription for Valtrex  has been completed.  Follow up with your new primary care provider for ongoing care and management.

## 2023-12-06 NOTE — ED Triage Notes (Signed)
 Pt reports follow- up from previous visit where she was diagnosed with herpes. Pt states she has been taking medications as prescribed but the sx's haven't gotten any better. Pt is wanting a refill on medication states that she has a primary care appointment coming up in July that is the earliest she could get in.

## 2023-12-25 ENCOUNTER — Telehealth: Admitting: Physician Assistant

## 2023-12-25 DIAGNOSIS — B354 Tinea corporis: Secondary | ICD-10-CM | POA: Diagnosis not present

## 2023-12-25 MED ORDER — CLOTRIMAZOLE-BETAMETHASONE 1-0.05 % EX CREA: 1.0000 | TOPICAL_CREAM | Freq: Every day | CUTANEOUS | 0 refills | Status: DC

## 2023-12-25 MED ORDER — TERBINAFINE HCL 250 MG PO TABS: 250.0000 mg | ORAL_TABLET | Freq: Every day | ORAL | 0 refills | Status: AC

## 2023-12-25 NOTE — Progress Notes (Signed)
 E Visit for Rash  We are sorry that you are not feeling well. Here is how we plan to help!  Based upon your presentation it appears you have a fungal infection.  I have prescribed: Terbinafine 250mg  Take 1 tablet daily for 14 days and Lotrisone cream to apply topically to affected areas daily for up to 14 days    HOME CARE:  Take cool showers and avoid direct sunlight. Apply cool compress or wet dressings. Take a bath in an oatmeal bath.  Sprinkle content of one Aveeno packet under running faucet with comfortably warm water.  Bathe for 15-20 minutes, 1-2 times daily.  Pat dry with a towel. Do not rub the rash. Use hydrocortisone cream. Take an antihistamine like Benadryl  for widespread rashes that itch.  The adult dose of Benadryl  is 25-50 mg by mouth 4 times daily. Caution:  This type of medication may cause sleepiness.  Do not drink alcohol, drive, or operate dangerous machinery while taking antihistamines.  Do not take these medications if you have prostate enlargement.  Read package instructions thoroughly on all medications that you take.  GET HELP RIGHT AWAY IF:  Symptoms don't go away after treatment. Severe itching that persists. If you rash spreads or swells. If you rash begins to smell. If it blisters and opens or develops a yellow-brown crust. You develop a fever. You have a sore throat. You become short of breath.  MAKE SURE YOU:  Understand these instructions. Will watch your condition. Will get help right away if you are not doing well or get worse.  Thank you for choosing an e-visit.  Your e-visit answers were reviewed by a board certified advanced clinical practitioner to complete your personal care plan. Depending upon the condition, your plan could have included both over the counter or prescription medications.  Please review your pharmacy choice. Make sure the pharmacy is open so you can pick up prescription now. If there is a problem, you may contact your  provider through [employer] and have the prescription routed to another pharmacy.  Your safety is important to us . If you have drug allergies check your prescription carefully.   For the next 24 hours you can use MyChart to ask questions about today's visit, request a non-urgent call back, or ask for a work or school excuse. You will get an email in the next two days asking about your experience. I hope that your e-visit has been valuable and will speed your recovery.    I have spent 5 minutes in review of e-visit questionnaire, review and updating patient chart, medical decision making and response to patient.   Angelia Kelp, PA-C

## 2024-01-05 ENCOUNTER — Telehealth: Admitting: Physician Assistant

## 2024-01-05 DIAGNOSIS — Z760 Encounter for issue of repeat prescription: Secondary | ICD-10-CM | POA: Diagnosis not present

## 2024-01-05 DIAGNOSIS — B009 Herpesviral infection, unspecified: Secondary | ICD-10-CM

## 2024-01-05 MED ORDER — VALACYCLOVIR HCL 500 MG PO TABS: 500.0000 mg | ORAL_TABLET | Freq: Two times a day (BID) | ORAL | 0 refills | Status: AC

## 2024-01-05 NOTE — Progress Notes (Signed)
  You will have to be seen in person for your medication refill by the prescribing provider.      NOTE: There will be NO CHARGE for this E-Visit   If you are having a true medical emergency, please call 911.     For an urgent face to face visit, [facility] has multiple urgent care centers for your convenience.  Click the link below for the full list of locations and hours, walk-in wait times, appointment scheduling options and driving directions:  Urgent Care - [place], [place], [place], [place], [place], [place]  [place]     Your MyChart E-visit questionnaire answers were reviewed by a board certified advanced clinical practitioner to complete your personal care plan based on your specific symptoms.    Thank you for using e-Visits.

## 2024-01-05 NOTE — Progress Notes (Signed)
 E-Visit for Herpes Simplex  We are sorry that you are not feeling well.  Here is how we plan to help!  Based on what you have shared ith me, it looks like you may be having an outbreak/flare-up of genital herpes.    I have prescribed I have prescribed Valacyclovir 500 mg Take one by mouth twice a day for 3 days.    If you have been prescribed long term medications to be taken on a regular basis, it is important to follow the recommendations and take them as ordered.    Outbreaks usually include blisters and open sores in the genital area. Outbreaks that happen after the first time are usually not as severe and do not last as long. Genital Herpes Simplex is a commonly sexually transmitted viral infection that is found worldwide. Most of these genital infections are caused by one or two herpes simplex viruses that is passed from person to person during vaginal, oral, or anal sex. Sometimes, people do not know they have herpes because they do not have any symptoms.  Please be aware that if you have genital herpes you can be contagious even when you are not having rash or flare-up and you may not have any symptoms, even when you are taking suppressive medicines.  Herpes cannot be cured. The disease usually causes most problems during the first few years. After that, the virus is still there, but it causes few to no symptoms. Even when the virus is active, people with herpes can take medicines to reduce and help prevent symptoms.  Herpes is an infection that can cause blisters and open sores on the genital area. Herpes is caused by a virus that is passed from person to person during vaginal, oral, or anal sex. Sometimes, people do not know they have herpes because they do not have any symptoms. Herpes cannot be cured. The disease usually causes most problems during the first few years. After that, the virus is still there, but it causes few to no symptoms. Even when the virus is active, people with herpes  can take medicines to reduce and help prevent symptoms.  If you have been prescribed medications to be taken on a regular basis, it is important to follow the recommendations and take them as ordered.  Some people with herpes never have any symptoms. But other people can develop symptoms within a few weeks of being infected with the herpes virus   Symptoms usually include blisters in the genital area. In women, this area includes the vagina, buttocks, anus, or thighs. In men, this area includes the penis, scrotum, anus, butt, or thighs. The blisters can become painful open sores, which then crust over as they heal. Sometimes, people can have other symptoms that include:  ?Blisters on the mouth or lips ?Fever, headache, or pain in the joints ?Trouble urinating  Outbreaks might occur every month or more often, or just once or twice a year. Sometimes, people can tell when an outbreak will occur, because they feel itching or pain beforehand. Sometimes they do not know that an outbreak is coming because they have no symptoms. Whatever your pattern is, keep in mind that herpes outbreaks usually become less frequent over time as you get older. Certain things, called "triggers," can make outbreaks more likely to occur. These include stress, sunlight, menstrual periods,or getting sick.  Antiviral therapy can shorten the duration of symptoms and signs in primary infection, which, when untreated, can be associated with significant increase in the  symptoms of the disease.  HOME CARE Use a portable bath (such as a "Sitz bath") where you can sit in warm water for about 20 minutes. Your bathtub could also work. Avoid bubble baths.  Keep the genital area clean and dry and avoid tight clothes.  Take over-the-counter pain medicine such as acetaminophen (brand name: Tylenol) or ibuprofen sample brand names: Advil, Motrin). But avoid aspirin.  Only take medications as instructed by your medical team.  You are  most likely to spread herpes to a sex partner when you have blisters and open sores on your body. But it's also possible to spread herpes to your partner when you do not have any symptoms. That is because herpes can be present on your body without causing any symptoms, like blisters or pain.  Telling your sex partner that you have herpes can be hard. But it can help protect them, since there are ways to lower the risk of spreading the infection.   Using a condom every time you have sex  Not having sex when you have symptoms  Not having oral sex if you have blisters or open sores (in the genital area or around your mouth)  MAKE SURE YOU   Understand these instructions. Do not have sex without using a condom until you have been seen by a doctor and as instructed by the provider If you are not better or improved within 7 days, you MUST have a follow up at your doctor or the health department for evaluation. There are other causes of rashes in the genital region.  Thank you for choosing an e-visit.  Your e-visit answers were reviewed by a board certified advanced clinical practitioner to complete your personal care plan. Depending upon the condition, your plan could have included both over the counter or prescription medications.  Please review your pharmacy choice. Make sure the pharmacy is open so you can pick up prescription now. If there is a problem, you may contact your provider through [employer] and have the prescription routed to another pharmacy.  Your safety is important to [place]. If you have drug allergies check your prescription carefully.   For the next 24 hours you can use MyChart to ask questions about today's visit, request a non-urgent call back, or ask for a work or school excuse. You will get an email in the next two days asking about your experience. I hope that your e-visit has been valuable and will speed your recovery.

## 2024-01-05 NOTE — Addendum Note (Signed)
 Addended byMarciana Settle on: 01/05/2024 05:41 PM   Modules accepted: Orders, Level of Service

## 2024-02-06 ENCOUNTER — Ambulatory Visit (INDEPENDENT_AMBULATORY_CARE_PROVIDER_SITE_OTHER): Admitting: Physician Assistant

## 2024-02-06 ENCOUNTER — Encounter: Payer: Self-pay | Admitting: Physician Assistant

## 2024-02-06 VITALS — BP 116/79 | HR 84 | Temp 98.3°F | Ht 69.0 in | Wt 203.0 lb

## 2024-02-06 DIAGNOSIS — A6004 Herpesviral vulvovaginitis: Secondary | ICD-10-CM

## 2024-02-06 DIAGNOSIS — Z7689 Persons encountering health services in other specified circumstances: Secondary | ICD-10-CM

## 2024-02-06 DIAGNOSIS — F410 Panic disorder [episodic paroxysmal anxiety] without agoraphobia: Secondary | ICD-10-CM | POA: Diagnosis not present

## 2024-02-06 DIAGNOSIS — A6000 Herpesviral infection of urogenital system, unspecified: Secondary | ICD-10-CM | POA: Insufficient documentation

## 2024-02-06 MED ORDER — VALACYCLOVIR HCL 500 MG PO TABS: 500.0000 mg | ORAL_TABLET | Freq: Two times a day (BID) | ORAL | 3 refills | Status: AC

## 2024-02-06 MED ORDER — HYDROXYZINE PAMOATE 25 MG PO CAPS: 25.0000 mg | ORAL_CAPSULE | Freq: Three times a day (TID) | ORAL | 0 refills | Status: AC | PRN

## 2024-02-06 NOTE — Assessment & Plan Note (Signed)
 Patient presents today with recent HSV diagnosis. No recent outbreaks or systemic symptoms. Starting on daily suppressive therapy. Follow up as needed or for yearly appointment.

## 2024-02-06 NOTE — Progress Notes (Signed)
 New Patient Office Visit  Subjective    Patient ID: ACHSAH MCQUADE, female    DOB: November 26, 1985  Age: 38 y.o. MRN: [id number]  CC:  Chief Complaint  Patient presents with   New Patient (Initial Visit)    April dx with herpes. Given short course of medication and would like to be on daily medication     HPI Harveen Flesch Banker presents to establish care  Patient presents today with past medical history significant for genital herpes and anxiety. She reports initial outbreak of herpes in April of this year. Treated with Valtrex  at that time. She denies systemic symptoms or additional outbreaks. She voices interest in daily suppressive therapy. Additionally, she reports history of anxiety with infrequent anxiety attacks due to feeling overwhelmed. She relates no interest in daily antidepressants, however would like to discuss alternative management.   Outpatient Encounter Medications as of 02/06/2024  Medication Sig   hydrOXYzine  (VISTARIL ) 25 MG capsule Take 1 capsule (25 mg total) by mouth every 8 (eight) hours as needed.   valACYclovir  (VALTREX ) 500 MG tablet Take 1 tablet (500 mg total) by mouth 2 (two) times daily.   levonorgestrel  (MIRENA ) 20 MCG/24HR IUD 1 each by Intrauterine route once.   [DISCONTINUED] cetirizine  (ZYRTEC  ALLERGY) 10 MG tablet Take 1 tablet (10 mg total) by mouth daily. (Patient not taking: Reported on 05/23/2022)   [DISCONTINUED] clotrimazole -betamethasone  (LOTRISONE ) cream Apply 1 Application topically daily.   No facility-administered encounter medications on file as of 02/06/2024.    Past Medical History:  Diagnosis Date   Herpes    Medical history non-contributory     Past Surgical History:  Procedure Laterality Date   NO PAST SURGERIES      Family History  Problem Relation Age of Onset   Cancer Paternal Grandfather        liver cancer   Stroke Paternal Grandmother    Tremor Paternal Grandmother    Diabetes Maternal Grandmother    Cancer Maternal  Grandmother        skin cancer   Heart disease Maternal Grandmother    Diabetes Maternal Grandfather    Heart disease Maternal Grandfather    Cancer Maternal Grandfather        skin cancer   Kidney Stones Father    Diabetes Mother    Cancer Brother        skin cancer   Cancer Maternal Aunt        pancreatic cancer    Social History   Socioeconomic History   Marital status: Divorced    Spouse name: Not on file   Number of children: Not on file   Years of education: Not on file   Highest education level: Not on file  Occupational History   Not on file  Tobacco Use   Smoking status: Former    Current packs/day: 0.25    Average packs/day: 0.3 packs/day for 10.0 years (2.5 ttl pk-yrs)    Types: Cigarettes   Smokeless tobacco: Never  Vaping Use   Vaping status: Some Days  Substance and Sexual Activity   Alcohol use: Yes    Comment: rarely   Drug use: No   Sexual activity: Yes    Birth control/protection: I.U.D.  Other Topics Concern   Not on file  Social History Narrative   Not on file   Social Drivers of Health   Financial Resource Strain: Low Risk  (05/23/2022)   Overall Financial Resource Strain (CARDIA)    Difficulty of Paying  Living Expenses: Not very hard  Food Insecurity: No Food Insecurity (05/23/2022)   Hunger Vital Sign    Worried About Running Out of Food in the Last Year: Never true    Ran Out of Food in the Last Year: Never true  Transportation Needs: No Transportation Needs (05/23/2022)   PRAPARE - Administrator, Civil Service (Medical): No    Lack of Transportation (Non-Medical): No  Physical Activity: Insufficiently Active (05/23/2022)   Exercise Vital Sign    Days of Exercise per Week: 3 days    Minutes of Exercise per Session: 40 min  Stress: Stress Concern Present (05/23/2022)   [employer] of Occupational Health - Occupational Stress Questionnaire    Feeling of Stress : Very much  Social Connections: Socially Isolated  (05/23/2022)   Social Connection and Isolation Panel    Frequency of Communication with Friends and Family: More than three times a week    Frequency of Social Gatherings with Friends and Family: Once a week    Attends Religious Services: Never    Database administrator or Organizations: No    Attends Banker Meetings: Never    Marital Status: Divorced  Catering manager Violence: Not At Risk (05/23/2022)   Humiliation, Afraid, Rape, and Kick questionnaire    Fear of Current or Ex-Partner: No    Emotionally Abused: No    Physically Abused: No    Sexually Abused: No    Review of Systems  Constitutional:  Negative for chills, fever and malaise/fatigue.  Eyes:  Negative for blurred vision and double vision.  Respiratory:  Negative for cough and shortness of breath.   Cardiovascular:  Negative for chest pain and palpitations.  Musculoskeletal:  Negative for joint pain and myalgias.  Neurological:  Negative for dizziness and headaches.  Psychiatric/Behavioral:  Negative for depression. The patient is nervous/anxious.         Objective    BP 116/79   Pulse 84   Temp 98.3 F (36.8 C)   Ht 5' 9 (1.753 m)   Wt 203 lb (92.1 kg)   SpO2 99%   BMI 29.98 kg/m   Physical Exam Constitutional:      Appearance: Normal appearance.  HENT:     Head: Normocephalic.     Mouth/Throat:     Mouth: Mucous membranes are moist.     Pharynx: Oropharynx is clear.  Eyes:     Extraocular Movements: Extraocular movements intact.     Conjunctiva/sclera: Conjunctivae normal.  Cardiovascular:     Rate and Rhythm: Normal rate and regular rhythm.     Heart sounds: Normal heart sounds. No murmur heard. Pulmonary:     Effort: Pulmonary effort is normal.     Breath sounds: Normal breath sounds. No wheezing or rales.  Musculoskeletal:     Right lower leg: No edema.     Left lower leg: No edema.  Skin:    General: Skin is warm and dry.  Neurological:     General: No focal deficit  present.     Mental Status: She is alert and oriented to person, place, and time.  Psychiatric:        Mood and Affect: Mood normal.        Behavior: Behavior normal.        Assessment & Plan:  Encounter to establish care  Herpes simplex vulvovaginitis Assessment & Plan: Patient presents today with recent HSV diagnosis. No recent outbreaks or systemic symptoms. Starting on daily  suppressive therapy. Follow up as needed or for yearly appointment.   Orders: -     valACYclovir  HCl; Take 1 tablet (500 mg total) by mouth 2 (two) times daily.  Dispense: 180 tablet; Refill: 3  Anxiety attack Assessment & Plan: Patient presents today with history of anxiety with occasional anxiety attacks. Treatment options discussed today to include daily medication, as needed medication, and therapy. Patient interested in as needed medication. Will start on hydroxyzine  as needed for anxiety attacks. Cautioned side effects. Follow up as needed.    Orders: -     hydrOXYzine  Pamoate; Take 1 capsule (25 mg total) by mouth every 8 (eight) hours as needed.  Dispense: 30 capsule; Refill: 0    Return in about 9 months (around 11/06/2024) for physical .   Charmaine Bahar Shelden, PA-C

## 2024-02-06 NOTE — Assessment & Plan Note (Signed)
 Patient presents today with history of anxiety with occasional anxiety attacks. Treatment options discussed today to include daily medication, as needed medication, and therapy. Patient interested in as needed medication. Will start on hydroxyzine  as needed for anxiety attacks. Cautioned side effects. Follow up as needed.

## 2024-11-18 ENCOUNTER — Encounter: Admitting: Physician Assistant
